# Patient Record
Sex: Female | Born: 1995 | Race: Black or African American | Hispanic: No | Marital: Single | Smoking: Never smoker
Health system: Southern US, Community
[De-identification: ages and names within clinical notes are randomized; demographics above are authoritative.]

## PROBLEM LIST (undated history)

## (undated) DIAGNOSIS — A749 Chlamydial infection, unspecified: Secondary | ICD-10-CM

---

## 2016-02-16 ENCOUNTER — Encounter (HOSPITAL_COMMUNITY): Payer: Self-pay | Admitting: Emergency Medicine

## 2016-02-16 ENCOUNTER — Emergency Department (HOSPITAL_COMMUNITY)
Admission: EM | Admit: 2016-02-16 | Discharge: 2016-02-16 | Disposition: A | Discharge status: Home / Self Care | Payer: Medicaid Other | Attending: Emergency Medicine | Admitting: Emergency Medicine

## 2016-02-16 ENCOUNTER — Emergency Department (HOSPITAL_COMMUNITY): Payer: Medicaid Other

## 2016-02-16 DIAGNOSIS — F17210 Nicotine dependence, cigarettes, uncomplicated: Secondary | ICD-10-CM | POA: Insufficient documentation

## 2016-02-16 DIAGNOSIS — R109 Unspecified abdominal pain: Secondary | ICD-10-CM

## 2016-02-16 DIAGNOSIS — R1030 Lower abdominal pain, unspecified: Secondary | ICD-10-CM | POA: Diagnosis present

## 2016-02-16 DIAGNOSIS — N3001 Acute cystitis with hematuria: Secondary | ICD-10-CM | POA: Diagnosis not present

## 2016-02-16 LAB — URINALYSIS, ROUTINE W REFLEX MICROSCOPIC
BILIRUBIN URINE: NEGATIVE
Glucose, UA: NEGATIVE mg/dL
KETONES UR: NEGATIVE mg/dL
NITRITE: NEGATIVE
Protein, ur: 30 mg/dL — AB
SPECIFIC GRAVITY, URINE: 1.018 (ref 1.005–1.030)
pH: 6.5 (ref 5.0–8.0)

## 2016-02-16 LAB — I-STAT BETA HCG BLOOD, ED (MC, WL, AP ONLY)

## 2016-02-16 LAB — URINE MICROSCOPIC-ADD ON

## 2016-02-16 MED ORDER — IBUPROFEN 400 MG PO TABS: 400.0000 mg | ORAL_TABLET | Freq: Three times a day (TID) | ORAL | 0 refills | Status: DC | PRN

## 2016-02-16 MED ORDER — IBUPROFEN 200 MG PO TABS
600.0000 mg | ORAL_TABLET | Freq: Once | ORAL | Status: AC
  Administered 2016-02-16: 600 mg via ORAL
  Filled 2016-02-16: qty 1

## 2016-02-16 MED ORDER — CEPHALEXIN 500 MG PO CAPS: 500.0000 mg | ORAL_CAPSULE | Freq: Three times a day (TID) | ORAL | 0 refills | Status: DC

## 2016-02-16 MED ORDER — CEPHALEXIN 250 MG PO CAPS
500.0000 mg | ORAL_CAPSULE | Freq: Once | ORAL | Status: AC
  Administered 2016-02-16: 500 mg via ORAL
  Filled 2016-02-16: qty 2

## 2016-02-16 MED ORDER — OXYCODONE-ACETAMINOPHEN 5-325 MG PO TABS
2.0000 | ORAL_TABLET | Freq: Once | ORAL | Status: AC
  Administered 2016-02-16: 2 via ORAL
  Filled 2016-02-16: qty 2

## 2016-02-16 NOTE — ED Provider Notes (Signed)
[facility]PT Provider Note   CSN: [id number] Arrival date & time: 02/16/16  0441     History   Chief Complaint Chief Complaint  Patient presents with  . Abdominal Pain  . Vaginal Pain    HPI Christina Gill is a 20 y.o. female.  HPI Patient presents to the emergency room with complaints of sharp stabbing lower abdominal and vaginal pain.  No vaginal discharge.  She does have some mild right flank pain yesterday.  No history of ureteral stone.  Denies nausea vomiting.  No fevers or chills.  Denies dysuria and urinary frequency.  She states the pain awoke her and she immediately came to the ER.  She's not tried a medications at home prior to arrival.  Currently her pain is mild to moderate in severity.   History reviewed. No pertinent past medical history.  There are no active problems to display for this patient.   History reviewed. No pertinent surgical history.  OB History    No data available       Home Medications    Prior to Admission medications   Medication Sig Start Date End Date Taking? Authorizing Provider  cephALEXin (KEFLEX) 500 MG capsule Take 1 capsule (500 mg total) by mouth 3 (three) times daily. 02/16/16   Azalia BilisKevin Zarahi Fuerst, MD  ibuprofen (ADVIL,MOTRIN) 400 MG tablet Take 1 tablet (400 mg total) by mouth every 8 (eight) hours as needed. 02/16/16   Azalia BilisKevin Traci Gafford, MD    Family History No family history on file.  Social History Social History  Substance Use Topics  . Smoking status: Current Every Day Smoker    Types: Cigarettes  . Smokeless tobacco: Never Used  . Alcohol use Not on file     Allergies   Patient has no known allergies.   Review of Systems Review of Systems  All other systems reviewed and are negative.    Physical Exam Updated Vital Signs BP 133/94 (BP Location: Right Arm)   Pulse 73   Temp 97.7 F (36.5 C) (Oral)   Resp 18   SpO2 100%   Physical Exam  Constitutional: She is oriented to person, place, and time. She  appears well-developed and well-nourished.  HENT:  Head: Normocephalic.  Eyes: EOM are normal.  Neck: Normal range of motion.  Cardiovascular: Normal rate.   Pulmonary/Chest: Effort normal.  Abdominal: She exhibits no distension.  Mild suprapubic tenderness.  No guarding or rebound.  Musculoskeletal: Normal range of motion.  Neurological: She is alert and oriented to person, place, and time.  Psychiatric: She has a normal mood and affect.  Nursing note and vitals reviewed.    ED Treatments / Results  Labs (all labs ordered are listed, but only abnormal results are displayed) Labs Reviewed  URINALYSIS, ROUTINE W REFLEX MICROSCOPIC (NOT AT [facility]) - Abnormal; Notable for the following:       Result Value   APPearance TURBID (*)    Hgb urine dipstick MODERATE (*)    Protein, ur 30 (*)    Leukocytes, UA LARGE (*)    All other components within normal limits  URINE MICROSCOPIC-ADD ON - Abnormal; Notable for the following:    Squamous Epithelial / LPF 0-5 (*)    Bacteria, UA FEW (*)    All other components within normal limits  URINE CULTURE  I-STAT BETA HCG BLOOD, ED ([facility], [facility], [facility] ONLY)    EKG  EKG Interpretation None       Radiology Ct Renal Stone Study  Result  Date: 02/16/2016 CLINICAL DATA:  Right lower quadrant abdominal pain and flank pain tonight. EXAM: CT ABDOMEN AND PELVIS WITHOUT CONTRAST TECHNIQUE: Multidetector CT imaging of the abdomen and pelvis was performed following the standard protocol without IV contrast. COMPARISON:  None. FINDINGS: Lower chest: No significant abnormality Hepatobiliary: No focal liver abnormality is seen. No gallstones, gallbladder wall thickening, or biliary dilatation. Pancreas: Unremarkable. No pancreatic ductal dilatation or surrounding inflammatory changes. Spleen: Normal in size without focal abnormality. Adrenals/Urinary Tract: Adrenal glands are unremarkable. Kidneys are normal, without renal calculi, focal lesion, or hydronephrosis.  Bladder is unremarkable. Stomach/Bowel: Stomach is within normal limits. Appendix appears normal. No evidence of bowel wall thickening, distention, or inflammatory changes. Vascular/Lymphatic: No significant vascular findings are present. No enlarged abdominal or pelvic lymph nodes. Reproductive: Uterus and bilateral adnexa are unremarkable. IUD appears appropriately positioned. Other: Small volume free pelvic fluid, particularly in the right adnexal region. Musculoskeletal: No significant abnormality. IMPRESSION: Small volume free pelvic fluid in the right adnexal region. Normal appendix. No urinary calculi. Electronically Signed   By: Ellery Plunkaniel R Mitchell M.D.   On: 02/16/2016 06:28    Procedures Procedures (including critical care time)  Medications Ordered in ED Medications  cephALEXin (KEFLEX) capsule 500 mg (not administered)  ibuprofen (ADVIL,MOTRIN) tablet 600 mg (600 mg Oral Given 02/16/16 [id number])  oxyCODONE-acetaminophen (PERCOCET/ROXICET) 5-325 MG per tablet 2 tablet (2 tablets Oral Given 02/16/16 [id number])     Initial Impression / Assessment and Plan / ED Course  I have reviewed the triage vital signs and the nursing notes.  Pertinent labs & imaging results that were available during my care of the patient were reviewed by me and considered in my medical decision making (see chart for details).  Clinical Course     Urine culture sent.  We'll initiate Keflex.  CT scan demonstrates no evidence of distal ureteral stone.  Well-appearing.  Nontoxic.  Keflex.  Patient given strict return precautions  Final Clinical Impressions(s) / ED Diagnoses   Final diagnoses:  Abdominal pain, unspecified abdominal location  Acute cystitis with hematuria    New Prescriptions New Prescriptions   CEPHALEXIN (KEFLEX) 500 MG CAPSULE    Take 1 capsule (500 mg total) by mouth 3 (three) times daily.   IBUPROFEN (ADVIL,MOTRIN) 400 MG TABLET    Take 1 tablet (400 mg total) by mouth every 8 (eight) hours as  needed.     Azalia BilisKevin Heidemarie Goodnow, MD 02/16/16 [phone]

## 2016-02-16 NOTE — ED Triage Notes (Signed)
Pt awoke from sleep at 3 a.m.with sudden sharp stabbing pain from lower abdomen into vagina.  No history of any pain like this in the past.  LMP 3 weeks ago.  Pt has an IUD in place for the past 8 or 9 months.  Had an IUD about a year ago but had to have it removed.

## 2016-02-18 LAB — URINE CULTURE: Culture: >=100000 — AB

## 2016-02-19 ENCOUNTER — Telehealth (HOSPITAL_BASED_OUTPATIENT_CLINIC_OR_DEPARTMENT_OTHER): Payer: Self-pay

## 2016-02-19 NOTE — Progress Notes (Signed)
ED Antimicrobial Stewardship Positive Culture Follow Up   Christina Gill is an 20 y.o. female who [facility] with a chief complaint of  Chief Complaint  Patient presents with  . Abdominal Pain  . Vaginal Pain    Recent Results (from the past 720 hour(s))  Urine culture     Status: Abnormal   Collection Time: 02/16/16  5:15 AM  Result Value Ref Range Status   Specimen Description URINE, RANDOM  Final   Special Requests NONE  Final   Culture (A)  Final    >=100,000 COLONIES/mL STAPHYLOCOCCUS SPECIES (COAGULASE NEGATIVE)   Report Status 02/18/2016 FINAL  Final   Organism ID, Bacteria STAPHYLOCOCCUS SPECIES (COAGULASE NEGATIVE) (A)  Final      Susceptibility   Staphylococcus species (coagulase negative) - MIC*    CIPROFLOXACIN <=0.5 SENSITIVE Sensitive     GENTAMICIN <=0.5 SENSITIVE Sensitive     NITROFURANTOIN <=16 SENSITIVE Sensitive     OXACILLIN 1 RESISTANT Resistant     TETRACYCLINE <=1 SENSITIVE Sensitive     VANCOMYCIN <=0.5 SENSITIVE Sensitive     TRIMETH/SULFA <=10 SENSITIVE Sensitive     CLINDAMYCIN <=0.25 SENSITIVE Sensitive     RIFAMPIN <=0.5 SENSITIVE Sensitive     Inducible Clindamycin NEGATIVE Sensitive     * >=100,000 COLONIES/mL STAPHYLOCOCCUS SPECIES (COAGULASE NEGATIVE)    [x]  Treated with Keflex, organism resistant to prescribed antimicrobial []  Patient discharged originally without antimicrobial agent and treatment is now indicated  New antibiotic prescription: Bactrim DS 1 tab bid x 5 days  ED Provider: Audry Piliyler Mohr, PA-C  Rolley SimsMartin, Chrystle Murillo Ann 02/19/2016, 10:19 AM Infectious Diseases Pharmacist Phone# [phone]

## 2016-02-19 NOTE — Telephone Encounter (Signed)
Post ED Visit - Positive Culture Follow-up: Successful Patient Follow-Up  Culture assessed and recommendations reviewed by: []  Enzo BiNathan Batchelder, Pharm.D. []  Celedonio MiyamotoJeremy Frens, Pharm.D., BCPS []  Garvin FilaMike Maccia, Pharm.D. [x]  Georgina PillionElizabeth Martin, Pharm.D., BCPS []  GreenMinh Pham, [place]arm.D., BCPS, AAHIVP []  Estella HuskMichelle Turner, Pharm.D., BCPS, AAHIVP []  Cassie Stewart, Pharm.D. []  Sherle Poeob Vincent, [address]Pharm[address]  Positive urine culture, >/= 100,000 colonies -> Staph species  []  Patient discharged without antimicrobial prescription and treatment is now indicated [x]  Organism is resistant to prescribed ED discharge antimicrobial(Keflex) []  Patient with positive blood cultures  Changes discussed with ED provider: Audry Piliyler Mohr PA New antibiotic prescription "Bactrim DS 1 tab BID x 5d"  [facility] [place] [phone], Rx given to RPh.  Contacted patient, date 02/19/2016, time 1113  Pt informed of Dx, need for addl tx and to stop Keflex.   Arvid RightClark, Devonna Oboyle Dorn 02/19/2016, 11:33 AM

## 2016-04-21 DIAGNOSIS — B379 Candidiasis, unspecified: Secondary | ICD-10-CM | POA: Diagnosis not present

## 2016-04-21 DIAGNOSIS — Z760 Encounter for issue of repeat prescription: Secondary | ICD-10-CM | POA: Diagnosis not present

## 2016-04-21 DIAGNOSIS — J329 Chronic sinusitis, unspecified: Secondary | ICD-10-CM | POA: Diagnosis not present

## 2016-05-24 DIAGNOSIS — Z111 Encounter for screening for respiratory tuberculosis: Secondary | ICD-10-CM | POA: Diagnosis not present

## 2016-05-27 DIAGNOSIS — Z111 Encounter for screening for respiratory tuberculosis: Secondary | ICD-10-CM | POA: Diagnosis not present

## 2016-06-04 DIAGNOSIS — R10814 Left lower quadrant abdominal tenderness: Secondary | ICD-10-CM | POA: Diagnosis not present

## 2016-06-04 DIAGNOSIS — R109 Unspecified abdominal pain: Secondary | ICD-10-CM | POA: Diagnosis not present

## 2016-06-04 DIAGNOSIS — R10813 Right lower quadrant abdominal tenderness: Secondary | ICD-10-CM | POA: Diagnosis not present

## 2016-06-28 DIAGNOSIS — R300 Dysuria: Secondary | ICD-10-CM | POA: Diagnosis not present

## 2016-06-28 DIAGNOSIS — Z0000 Encounter for general adult medical examination without abnormal findings: Secondary | ICD-10-CM | POA: Diagnosis not present

## 2016-06-28 DIAGNOSIS — N760 Acute vaginitis: Secondary | ICD-10-CM | POA: Diagnosis not present

## 2016-07-12 DIAGNOSIS — Z13220 Encounter for screening for lipoid disorders: Secondary | ICD-10-CM | POA: Diagnosis not present

## 2016-07-12 DIAGNOSIS — Z7189 Other specified counseling: Secondary | ICD-10-CM | POA: Diagnosis not present

## 2016-07-12 DIAGNOSIS — E669 Obesity, unspecified: Secondary | ICD-10-CM | POA: Diagnosis not present

## 2016-07-12 DIAGNOSIS — Z0000 Encounter for general adult medical examination without abnormal findings: Secondary | ICD-10-CM | POA: Diagnosis not present

## 2016-07-12 DIAGNOSIS — Z23 Encounter for immunization: Secondary | ICD-10-CM | POA: Diagnosis not present

## 2016-07-12 DIAGNOSIS — Z131 Encounter for screening for diabetes mellitus: Secondary | ICD-10-CM | POA: Diagnosis not present

## 2016-09-23 ENCOUNTER — Encounter (HOSPITAL_BASED_OUTPATIENT_CLINIC_OR_DEPARTMENT_OTHER): Payer: Self-pay

## 2016-09-23 DIAGNOSIS — N7689 Other specified inflammation of vagina and vulva: Secondary | ICD-10-CM | POA: Diagnosis not present

## 2016-09-23 DIAGNOSIS — F17210 Nicotine dependence, cigarettes, uncomplicated: Secondary | ICD-10-CM | POA: Diagnosis not present

## 2016-09-23 DIAGNOSIS — J029 Acute pharyngitis, unspecified: Secondary | ICD-10-CM | POA: Diagnosis not present

## 2016-09-23 DIAGNOSIS — N739 Female pelvic inflammatory disease, unspecified: Secondary | ICD-10-CM | POA: Diagnosis not present

## 2016-09-23 LAB — PREGNANCY, URINE: Preg Test, Ur: NEGATIVE

## 2016-09-23 LAB — URINALYSIS, ROUTINE W REFLEX MICROSCOPIC
BILIRUBIN URINE: NEGATIVE
GLUCOSE, UA: NEGATIVE mg/dL
HGB URINE DIPSTICK: NEGATIVE
Ketones, ur: 15 mg/dL — AB
Nitrite: NEGATIVE
PROTEIN: NEGATIVE mg/dL
SPECIFIC GRAVITY, URINE: 1.028 (ref 1.005–1.030)
pH: 7 (ref 5.0–8.0)

## 2016-09-23 LAB — URINALYSIS, MICROSCOPIC (REFLEX)

## 2016-09-23 NOTE — ED Triage Notes (Signed)
Pt reports white, vaginal discharge, pain and itching x2 weeks. Associated sore throat. Denies any attempts at OTC treatment.

## 2016-09-24 ENCOUNTER — Emergency Department (HOSPITAL_BASED_OUTPATIENT_CLINIC_OR_DEPARTMENT_OTHER)
Admission: EM | Admit: 2016-09-24 | Discharge: 2016-09-24 | Disposition: A | Discharge status: Home / Self Care | Payer: BLUE CROSS/BLUE SHIELD | Attending: Emergency Medicine | Admitting: Emergency Medicine

## 2016-09-24 DIAGNOSIS — N739 Female pelvic inflammatory disease, unspecified: Secondary | ICD-10-CM

## 2016-09-24 DIAGNOSIS — J029 Acute pharyngitis, unspecified: Secondary | ICD-10-CM

## 2016-09-24 LAB — WET PREP, GENITAL
Clue Cells Wet Prep HPF POC: NONE SEEN
SPERM: NONE SEEN
Trich, Wet Prep: NONE SEEN
Yeast Wet Prep HPF POC: NONE SEEN

## 2016-09-24 MED ORDER — CEFTRIAXONE SODIUM 250 MG IJ SOLR
250.0000 mg | Freq: Once | INTRAMUSCULAR | Status: AC
  Administered 2016-09-24: 250 mg via INTRAMUSCULAR
  Filled 2016-09-24: qty 250

## 2016-09-24 MED ORDER — DOXYCYCLINE HYCLATE 100 MG PO TABS
100.0000 mg | ORAL_TABLET | Freq: Once | ORAL | Status: AC
  Administered 2016-09-24: 100 mg via ORAL
  Filled 2016-09-24: qty 1

## 2016-09-24 MED ORDER — DOXYCYCLINE HYCLATE 100 MG PO CAPS: 100.0000 mg | ORAL_CAPSULE | Freq: Two times a day (BID) | ORAL | 0 refills | Status: DC

## 2016-09-24 NOTE — ED Provider Notes (Signed)
[facility]   CSN: [id number] Arrival date & time: 09/23/16  2315     History   Chief Complaint Chief Complaint  Patient presents with  . Vaginal Itching    HPI Christina Gill is a 21 y.o. female.  HPI Patient reports new vaginal discharge and itching over the past 2 weeks.  She reports unprotected sexual contacts.  She does have a history of STDs before in the past.  She denies significant abdominal pain.  Denies nausea vomiting diarrhea.  No fevers or chills.  No back pain or flank pain.  She reports mild sore throat over the past several days as well.  Some coughing congestion.  No shortness of breath.  Symptoms are moderate in severity.   History reviewed. No pertinent past medical history.  There are no active problems to display for this patient.   History reviewed. No pertinent surgical history.  OB History    No data available       Home Medications    Prior to Admission medications   Medication Sig Start Date End Date Taking? Authorizing Provider  cephALEXin (KEFLEX) 500 MG capsule Take 1 capsule (500 mg total) by mouth 3 (three) times daily. 02/16/16   Azalia Bilis, MD  doxycycline (VIBRAMYCIN) 100 MG capsule Take 1 capsule (100 mg total) by mouth 2 (two) times daily. 09/24/16   Azalia Bilis, MD  ibuprofen (ADVIL,MOTRIN) 400 MG tablet Take 1 tablet (400 mg total) by mouth every 8 (eight) hours as needed. 02/16/16   Azalia Bilis, MD    Family History History reviewed. No pertinent family history.  Social History Social History  Substance Use Topics  . Smoking status: Current Every Day Smoker    Types: Cigarettes  . Smokeless tobacco: Never Used  . Alcohol use No     Allergies   Patient has no known allergies.   Review of Systems Review of Systems  All other systems reviewed and are negative.    Physical Exam Updated Vital Signs BP (!) 130/92 (BP Location: Left Arm)   Pulse 73   Temp 98.6 F (37 C)  (Oral)   Resp 16   Ht 5\' 7"  (1.702 m)   Wt 83.9 kg (185 lb)   LMP 09/23/2016   SpO2 100%   BMI 28.98 kg/m   Physical Exam  Constitutional: She is oriented to person, place, and time. She appears well-developed and well-nourished.  HENT:  Head: Normocephalic.  Uvula midline.  Mild posterior pharyngeal erythema.  No tonsillar swelling or exudate.  Eyes: EOM are normal.  Neck: Normal range of motion.  Pulmonary/Chest: Effort normal.  Abdominal: She exhibits no distension.  Genitourinary:  Genitourinary Comments: Chaperone present.  Mild cervical and vaginal discharge noted.  No cervical erosions.  No significant cervical motion tenderness.  IUD string is visible.  Os is closed.  Musculoskeletal: Normal range of motion.  Neurological: She is alert and oriented to person, place, and time.  Psychiatric: She has a normal mood and affect.  Nursing note and vitals reviewed.    ED Treatments / Results  Labs (all labs ordered are listed, but only abnormal results are displayed) Labs Reviewed  WET PREP, GENITAL - Abnormal; Notable for the following:       Result Value   WBC, Wet Prep HPF POC MANY (*)    All other components within normal limits  URINALYSIS, ROUTINE W REFLEX MICROSCOPIC - Abnormal; Notable for the following:    Color, Urine AMBER (*)  Ketones, ur 15 (*)    Leukocytes, UA TRACE (*)    All other components within normal limits  URINALYSIS, MICROSCOPIC (REFLEX) - Abnormal; Notable for the following:    Bacteria, UA FEW (*)    Squamous Epithelial / LPF 6-30 (*)    All other components within normal limits  PREGNANCY, URINE  HIV ANTIBODY (ROUTINE TESTING)  RPR  GC/CHLAMYDIA PROBE AMP ([place]) NOT AT [facility]    EKG  EKG Interpretation None       Radiology No results found.  Procedures Procedures (including critical care time)  Medications Ordered in ED Medications  cefTRIAXone (ROCEPHIN) injection 250 mg (250 mg Intramuscular Given 09/24/16 0217)    doxycycline (VIBRA-TABS) tablet 100 mg (100 mg Oral Given 09/24/16 0217)     Initial Impression / Assessment and Plan / ED Course  I have reviewed the triage vital signs and the nursing notes.  Pertinent labs & imaging results that were available during my care of the patient were reviewed by me and considered in my medical decision making (see chart for details).     Patient be covered for early pelvic inflammatory disease with Rocephin and doxycycline.  Recommended all sexual partners be seen and evaluated at the health department.  Acute pharyngitis likely viral in nature.  Final Clinical Impressions(s) / ED Diagnoses   Final diagnoses:  Acute pharyngitis, unspecified etiology  Pelvic inflammatory disease (PID)    New Prescriptions Discharge Medication List as of 09/24/2016  2:07 AM    START taking these medications   Details  doxycycline (VIBRAMYCIN) 100 MG capsule Take 1 capsule (100 mg total) by mouth 2 (two) times daily., Starting Fri 09/24/2016, Print         Azalia Bilisampos, Lucion Dilger, MD 09/24/16 [phone]

## 2016-09-25 LAB — RPR: RPR: NONREACTIVE

## 2016-09-25 LAB — HIV ANTIBODY (ROUTINE TESTING W REFLEX): HIV SCREEN 4TH GENERATION: NONREACTIVE

## 2016-09-27 LAB — GC/CHLAMYDIA PROBE AMP (~~LOC~~) NOT AT ARMC
CHLAMYDIA, DNA PROBE: NEGATIVE
NEISSERIA GONORRHEA: NEGATIVE

## 2016-09-27 NOTE — ED Notes (Signed)
Pt. Called for her STD results.  Results given.

## 2017-01-19 ENCOUNTER — Other Ambulatory Visit: Payer: Self-pay

## 2017-01-19 ENCOUNTER — Encounter (HOSPITAL_COMMUNITY): Payer: Self-pay | Admitting: Emergency Medicine

## 2017-01-19 ENCOUNTER — Ambulatory Visit (HOSPITAL_COMMUNITY)
Admission: EM | Admit: 2017-01-19 | Discharge: 2017-01-19 | Disposition: A | Discharge status: Home / Self Care | Payer: BLUE CROSS/BLUE SHIELD | Attending: Family Medicine | Admitting: Family Medicine

## 2017-01-19 DIAGNOSIS — Z3202 Encounter for pregnancy test, result negative: Secondary | ICD-10-CM | POA: Diagnosis not present

## 2017-01-19 DIAGNOSIS — R1032 Left lower quadrant pain: Secondary | ICD-10-CM | POA: Diagnosis not present

## 2017-01-19 DIAGNOSIS — N898 Other specified noninflammatory disorders of vagina: Secondary | ICD-10-CM | POA: Insufficient documentation

## 2017-01-19 DIAGNOSIS — R1030 Lower abdominal pain, unspecified: Secondary | ICD-10-CM | POA: Diagnosis not present

## 2017-01-19 DIAGNOSIS — Z975 Presence of (intrauterine) contraceptive device: Secondary | ICD-10-CM | POA: Diagnosis not present

## 2017-01-19 DIAGNOSIS — R1031 Right lower quadrant pain: Secondary | ICD-10-CM

## 2017-01-19 LAB — POCT PREGNANCY, URINE: Preg Test, Ur: NEGATIVE

## 2017-01-19 LAB — POCT URINALYSIS DIP (DEVICE)
GLUCOSE, UA: NEGATIVE mg/dL
Ketones, ur: 15 mg/dL — AB
LEUKOCYTES UA: NEGATIVE
NITRITE: NEGATIVE
PROTEIN: 30 mg/dL — AB
Specific Gravity, Urine: >=1.03 (ref 1.005–1.030)
UROBILINOGEN UA: 1 mg/dL (ref 0.0–1.0)
pH: 6.5 (ref 5.0–8.0)

## 2017-01-19 MED ORDER — METRONIDAZOLE 500 MG PO TABS: 500.0000 mg | ORAL_TABLET | Freq: Two times a day (BID) | ORAL | 0 refills | Status: DC

## 2017-01-19 MED ORDER — AZITHROMYCIN 250 MG PO TABS
ORAL_TABLET | ORAL | Status: AC
  Filled 2017-01-19: qty 4

## 2017-01-19 MED ORDER — STERILE WATER FOR INJECTION IJ SOLN
INTRAMUSCULAR | Status: AC
  Filled 2017-01-19: qty 10

## 2017-01-19 MED ORDER — CEFTRIAXONE SODIUM 250 MG IJ SOLR
250.0000 mg | Freq: Once | INTRAMUSCULAR | Status: AC
  Administered 2017-01-19: 250 mg via INTRAMUSCULAR

## 2017-01-19 MED ORDER — AZITHROMYCIN 250 MG PO TABS
1000.0000 mg | ORAL_TABLET | Freq: Once | ORAL | Status: AC
  Administered 2017-01-19: 1000 mg via ORAL

## 2017-01-19 MED ORDER — CEFTRIAXONE SODIUM 250 MG IJ SOLR
INTRAMUSCULAR | Status: AC
  Filled 2017-01-19: qty 250

## 2017-01-19 NOTE — ED Triage Notes (Signed)
Pt reports lower abdominal/suprapubic pain, nausea, loss of appetite and headache for over one week.  She also reports a creamy white vaginal d/c with odor.

## 2017-01-19 NOTE — Medical Student Note (Signed)
[facility] Provider Student Note For educational purposes for Medical, PA and NP students only and not part of the legal medical record.   CSN: [id number] Arrival date & time: 01/19/17  1135   History   Chief Complaint Chief Complaint  Patient presents with  . Abdominal Pain  . Nausea  . Headache    HPI Renard Hampersha Phillips-Campbell is a 21 y.o. female who is complaining of lower abdominal pain, nausea, vaginal discharge with odor, and decreased appetite for 1.5 weeks. LMP was 10/23. She is lesbian and has one partner. She is not concerned about pregnancy. She has had unprotected intercourse but she is not concerned about STDs because it is a monogamous relationship. She also complains of urinary leakage and increased BM frequency (usually 1x per week, now daily). She denies diarrhea, fever, vaginal itching, dysuria.     History reviewed. No pertinent past medical history.  There are no active problems to display for this patient.   History reviewed. No pertinent surgical history.  OB History    No data available       Home Medications    Prior to Admission medications   Medication Sig Start Date End Date Taking? Authorizing Provider  cephALEXin (KEFLEX) 500 MG capsule Take 1 capsule (500 mg total) by mouth 3 (three) times daily. 02/16/16   Azalia Bilisampos, Kevin, MD  doxycycline (VIBRAMYCIN) 100 MG capsule Take 1 capsule (100 mg total) by mouth 2 (two) times daily. 09/24/16   Azalia Bilisampos, Kevin, MD  ibuprofen (ADVIL,MOTRIN) 400 MG tablet Take 1 tablet (400 mg total) by mouth every 8 (eight) hours as needed. 02/16/16   Azalia Bilisampos, Kevin, MD    Family History History reviewed. No pertinent family history.  Social History Social History   Tobacco Use  . Smoking status: Never Smoker  . Smokeless tobacco: Never Used  Substance Use Topics  . Alcohol use: Yes    Comment: occasional  . Drug use: Yes    Frequency: 7.0 times per week    Types: Marijuana     Allergies   Patient  has no known allergies.   Review of Systems Review of Systems  Constitutional: Positive for appetite change and chills. Negative for fever.  Gastrointestinal: Positive for abdominal pain and nausea. Negative for blood in stool, constipation, diarrhea and vomiting.  Genitourinary: Positive for vaginal discharge. Negative for difficulty urinating, dysuria, frequency, hematuria, urgency and vaginal bleeding.     Physical Exam Updated Vital Signs BP (!) 138/96 (BP Location: Left Arm)   Pulse 91   Temp 98.1 F (36.7 C) (Oral)   LMP 01/04/2017 (Exact Date)   SpO2 100%   Physical Exam  Constitutional: She appears well-developed and well-nourished.  Eyes: Pupils are equal, round, and reactive to light.  Cardiovascular: Normal rate and regular rhythm.  Pulmonary/Chest: Effort normal and breath sounds normal.  Abdominal: Soft. Normal appearance and bowel sounds are normal. She exhibits no distension and no mass. There is tenderness in the right lower quadrant, suprapubic area and left lower quadrant. There is no rigidity and no guarding.  Skin: Skin is warm and dry.     ED Treatments / Results  Labs (all labs ordered are listed, but only abnormal results are displayed) Labs Reviewed  POCT URINALYSIS DIP (DEVICE) - Abnormal; Notable for the following components:      Result Value   Bilirubin Urine SMALL (*)    Ketones, ur 15 (*)    Hgb urine dipstick TRACE (*)    Protein,  ur 30 (*)    All other components within normal limits  URINE CULTURE  POCT PREGNANCY, URINE  URINE CYTOLOGY ANCILLARY ONLY    Medications Ordered in ED Medications  cefTRIAXone (ROCEPHIN) injection 250 mg (not administered)  azithromycin (ZITHROMAX) tablet 1,000 mg (not administered)     Initial Impression / Assessment and Plan / ED Course  I have reviewed the triage vital signs and the nursing notes.  Pertinent labs & imaging results that were available during my care of the patient were reviewed by me  and considered in my medical decision making (see chart for details).     Urine culture and cytology sent. Patient treated empirically for GC/chlamydia and given prescription for Flagyl. She will follow up if symptoms do not improve in the next few days.  Final Clinical Impressions(s) / ED Diagnoses   Final diagnoses:  None    New Prescriptions This SmartLink is deprecated. Use AVSMEDLIST instead to display the medication list for a patient.

## 2017-01-19 NOTE — Discharge Instructions (Addendum)
Please return here or to the Emergency Department immediately should you feel worse in any way or have any of the following symptoms: increasing or different abdominal pain, persistent vomiting, fevers, or shaking chills. Please follow up here or with your primary care doctor for a recheck in 48 hours or sooner in order to ensure that you are not developing a problem that might require surgery or hospitalization.

## 2017-01-20 LAB — URINE CYTOLOGY ANCILLARY ONLY
CHLAMYDIA, DNA PROBE: NEGATIVE
NEISSERIA GONORRHEA: NEGATIVE
TRICH (WINDOWPATH): NEGATIVE

## 2017-01-20 LAB — URINE CULTURE: Culture: 2000 — AB

## 2017-01-24 LAB — URINE CYTOLOGY ANCILLARY ONLY: CANDIDA VAGINITIS: NEGATIVE

## 2017-01-26 NOTE — ED Provider Notes (Signed)
[facility]   [id number] 01/19/17 Arrival Time: 1135  ASSESSMENT & PLAN:  1. Abdominal discomfort, bilateral lower quadrant   2. Vaginal discharge     Meds ordered this encounter  Medications  . metroNIDAZOLE (FLAGYL) 500 MG tablet    Sig: Take 1 tablet (500 mg total) 2 (two) times daily by mouth.    Dispense:  14 tablet    Refill:  0  . cefTRIAXone (ROCEPHIN) injection 250 mg  . azithromycin (ZITHROMAX) tablet 1,000 mg   Empiric treatment given. Urine cytology sent. She may f/u if not improving over the next 24-48 hours.  Reviewed expectations re: course of current medical issues. Questions answered. Outlined signs and symptoms indicating need for more acute intervention. Patient verbalized understanding. After Visit Summary given.   SUBJECTIVE:  Christina Gill is a 21 y.o. female who presents with complaint of lower abdominal discomfort with loss of appetite. Gradual onset over the past week. White vaginal discharge with odor. H/O similar when she was diagnosed with BV; "feels the same." No specific pelvic pain. No urinary symptoms. Sexually active with female partner. Occasional condom use. Patient's last menstrual period was 01/04/2017 (exact date).  ROS: As per HPI.   OBJECTIVE:  Vitals:   01/19/17 1153  BP: (!) 138/96  Pulse: 91  Temp: 98.1 F (36.7 C)  TempSrc: Oral  SpO2: 100%    General appearance: alert; no distress Lungs: clear to auscultation bilaterally Heart: regular rate and rhythm Abdomen: soft, non-tender; bowel sounds normal; no masses or organomegaly; no guarding or rebound tenderness GU: declines Back: no CVA tenderness Extremities: no cyanosis or edema; symmetrical with no gross deformities Skin: warm and dry Psychological: alert and cooperative; normal mood and affect  Labs: Results for orders placed or performed during the hospital encounter of 01/19/17  Urine culture  Result Value Ref Range   Specimen Description  URINE, RANDOM    Special Requests NONE    Culture (A)     2,000 COLONIES/mL GROUP B STREP(S.AGALACTIAE)ISOLATED WITH MIXED BACTERIAL ORGANISMS    Report Status 01/20/2017 FINAL   POCT urinalysis dip (device)  Result Value Ref Range   Glucose, UA NEGATIVE NEGATIVE mg/dL   Bilirubin Urine SMALL (A) NEGATIVE   Ketones, ur 15 (A) NEGATIVE mg/dL   Specific Gravity, Urine >=1.030 1.005 - 1.030   Hgb urine dipstick TRACE (A) NEGATIVE   pH 6.5 5.0 - 8.0   Protein, ur 30 (A) NEGATIVE mg/dL   Urobilinogen, UA 1.0 0.0 - 1.0 mg/dL   Nitrite NEGATIVE NEGATIVE   Leukocytes, UA NEGATIVE NEGATIVE  Pregnancy, urine POC  Result Value Ref Range   Preg Test, Ur NEGATIVE NEGATIVE  Urine cytology ancillary only  Result Value Ref Range   Chlamydia Negative    Neisseria gonorrhea Negative    Trichomonas Negative   Urine cytology ancillary only  Result Value Ref Range   Bacterial vaginitis (A)     **POSITIVE for Gardnerella vaginalis POSITIVE for Atopobium vaginae POSITIVE for Megasphaera 2**   Candida vaginitis Negative for Candida Vaginitis Microorganisms    Labs Reviewed  URINE CULTURE - Abnormal; Notable for the following components:      Result Value   Culture   (*)    Value: 2,000 COLONIES/mL GROUP B STREP(S.AGALACTIAE)ISOLATED WITH MIXED BACTERIAL ORGANISMS    All other components within normal limits  POCT URINALYSIS DIP (DEVICE) - Abnormal; Notable for the following components:   Bilirubin Urine SMALL (*)    Ketones, ur 15 (*)  Hgb urine dipstick TRACE (*)    Protein, ur 30 (*)    All other components within normal limits  URINE CYTOLOGY ANCILLARY ONLY - Abnormal; Notable for the following components:   Bacterial vaginitis   (*)    Value: **POSITIVE for Gardnerella vaginalis POSITIVE for Atopobium vaginae POSITIVE for Megasphaera 2**   All other components within normal limits  POCT PREGNANCY, URINE  URINE CYTOLOGY ANCILLARY ONLY    No Known Allergies  History reviewed. No  pertinent past medical history. Social History   Socioeconomic History  . Marital status: Single    Spouse name: Not on file  . Number of children: Not on file  . Years of education: Not on file  . Highest education level: Not on file  Social Needs  . Financial resource strain: Not on file  . Food insecurity - worry: Not on file  . Food insecurity - inability: Not on file  . Transportation needs - medical: Not on file  . Transportation needs - non-medical: Not on file  Occupational History  . Not on file  Tobacco Use  . Smoking status: Never Smoker  . Smokeless tobacco: Never Used  Substance and Sexual Activity  . Alcohol use: Yes    Comment: occasional  . Drug use: Yes    Frequency: 7.0 times per week    Types: Marijuana  . Sexual activity: Yes    Birth control/protection: IUD  Other Topics Concern  . Not on file  Social History Narrative  . Not on file      Mardella LaymanHagler, Leatha Rohner, MD 01/26/17 1014

## 2017-05-27 DIAGNOSIS — L700 Acne vulgaris: Secondary | ICD-10-CM | POA: Diagnosis not present

## 2017-06-09 DIAGNOSIS — L308 Other specified dermatitis: Secondary | ICD-10-CM | POA: Diagnosis not present

## 2017-10-14 DIAGNOSIS — Z114 Encounter for screening for human immunodeficiency virus [HIV]: Secondary | ICD-10-CM | POA: Diagnosis not present

## 2017-10-14 DIAGNOSIS — Z118 Encounter for screening for other infectious and parasitic diseases: Secondary | ICD-10-CM | POA: Diagnosis not present

## 2017-10-14 DIAGNOSIS — Z01419 Encounter for gynecological examination (general) (routine) without abnormal findings: Secondary | ICD-10-CM | POA: Diagnosis not present

## 2017-10-14 DIAGNOSIS — N760 Acute vaginitis: Secondary | ICD-10-CM | POA: Diagnosis not present

## 2017-10-14 DIAGNOSIS — Z6826 Body mass index (BMI) 26.0-26.9, adult: Secondary | ICD-10-CM | POA: Diagnosis not present

## 2017-10-14 DIAGNOSIS — Z1159 Encounter for screening for other viral diseases: Secondary | ICD-10-CM | POA: Diagnosis not present

## 2017-10-14 DIAGNOSIS — Z113 Encounter for screening for infections with a predominantly sexual mode of transmission: Secondary | ICD-10-CM | POA: Diagnosis not present

## 2017-10-14 DIAGNOSIS — N898 Other specified noninflammatory disorders of vagina: Secondary | ICD-10-CM | POA: Diagnosis not present

## 2017-12-22 DIAGNOSIS — Z113 Encounter for screening for infections with a predominantly sexual mode of transmission: Secondary | ICD-10-CM | POA: Diagnosis not present

## 2017-12-22 DIAGNOSIS — Z118 Encounter for screening for other infectious and parasitic diseases: Secondary | ICD-10-CM | POA: Diagnosis not present

## 2017-12-22 DIAGNOSIS — A5901 Trichomonal vulvovaginitis: Secondary | ICD-10-CM | POA: Diagnosis not present

## 2018-03-25 ENCOUNTER — Emergency Department (HOSPITAL_BASED_OUTPATIENT_CLINIC_OR_DEPARTMENT_OTHER)
Admission: EM | Admit: 2018-03-25 | Discharge: 2018-03-25 | Discharge status: Against Medical Advice | Payer: BLUE CROSS/BLUE SHIELD | Attending: Emergency Medicine | Admitting: Emergency Medicine

## 2018-03-25 ENCOUNTER — Other Ambulatory Visit: Payer: Self-pay

## 2018-03-25 ENCOUNTER — Encounter (HOSPITAL_BASED_OUTPATIENT_CLINIC_OR_DEPARTMENT_OTHER): Payer: Self-pay | Admitting: Emergency Medicine

## 2018-03-25 DIAGNOSIS — Z5321 Procedure and treatment not carried out due to patient leaving prior to being seen by health care provider: Secondary | ICD-10-CM | POA: Diagnosis not present

## 2018-03-25 DIAGNOSIS — N898 Other specified noninflammatory disorders of vagina: Secondary | ICD-10-CM | POA: Insufficient documentation

## 2018-03-25 HISTORY — DX: Chlamydial infection, unspecified: A74.9

## 2018-03-25 LAB — URINALYSIS, ROUTINE W REFLEX MICROSCOPIC
Bilirubin Urine: NEGATIVE
Glucose, UA: NEGATIVE mg/dL
Ketones, ur: NEGATIVE mg/dL
LEUKOCYTES UA: NEGATIVE
Nitrite: NEGATIVE
Protein, ur: NEGATIVE mg/dL
SPECIFIC GRAVITY, URINE: 1.015 (ref 1.005–1.030)
pH: 6 (ref 5.0–8.0)

## 2018-03-25 LAB — URINALYSIS, MICROSCOPIC (REFLEX)

## 2018-03-25 LAB — PREGNANCY, URINE: Preg Test, Ur: NEGATIVE

## 2018-03-25 NOTE — ED Notes (Signed)
Pelvic Cart at bedside 

## 2018-03-25 NOTE — ED Triage Notes (Signed)
Vaginal discharge for a few weeks. Recently treated for chlamydia.

## 2018-11-06 DIAGNOSIS — H5213 Myopia, bilateral: Secondary | ICD-10-CM | POA: Diagnosis not present

## 2019-05-15 DIAGNOSIS — Z114 Encounter for screening for human immunodeficiency virus [HIV]: Secondary | ICD-10-CM | POA: Diagnosis not present

## 2019-05-15 DIAGNOSIS — Z1159 Encounter for screening for other viral diseases: Secondary | ICD-10-CM | POA: Diagnosis not present

## 2019-05-15 DIAGNOSIS — N760 Acute vaginitis: Secondary | ICD-10-CM | POA: Diagnosis not present

## 2019-05-15 DIAGNOSIS — Z113 Encounter for screening for infections with a predominantly sexual mode of transmission: Secondary | ICD-10-CM | POA: Diagnosis not present

## 2019-05-15 DIAGNOSIS — Z118 Encounter for screening for other infectious and parasitic diseases: Secondary | ICD-10-CM | POA: Diagnosis not present

## 2019-05-15 DIAGNOSIS — Z30432 Encounter for removal of intrauterine contraceptive device: Secondary | ICD-10-CM | POA: Diagnosis not present

## 2019-05-16 ENCOUNTER — Other Ambulatory Visit: Payer: Self-pay

## 2019-05-16 ENCOUNTER — Emergency Department (HOSPITAL_COMMUNITY)
Admission: EM | Admit: 2019-05-16 | Discharge: 2019-05-17 | Disposition: A | Discharge status: Other Acute Inpatient Hospital | Payer: BC Managed Care – PPO | Attending: Emergency Medicine | Admitting: Emergency Medicine

## 2019-05-16 ENCOUNTER — Encounter (HOSPITAL_COMMUNITY): Payer: Self-pay

## 2019-05-16 DIAGNOSIS — R45851 Suicidal ideations: Secondary | ICD-10-CM | POA: Insufficient documentation

## 2019-05-16 DIAGNOSIS — T391X2A Poisoning by 4-Aminophenol derivatives, intentional self-harm, initial encounter: Secondary | ICD-10-CM | POA: Diagnosis not present

## 2019-05-16 DIAGNOSIS — F322 Major depressive disorder, single episode, severe without psychotic features: Secondary | ICD-10-CM | POA: Diagnosis not present

## 2019-05-16 DIAGNOSIS — R000 Tachycardia, unspecified: Secondary | ICD-10-CM | POA: Diagnosis not present

## 2019-05-16 DIAGNOSIS — T398X1A Poisoning by other nonopioid analgesics and antipyretics, not elsewhere classified, accidental (unintentional), initial encounter: Secondary | ICD-10-CM | POA: Diagnosis not present

## 2019-05-16 DIAGNOSIS — T887XXA Unspecified adverse effect of drug or medicament, initial encounter: Secondary | ICD-10-CM | POA: Diagnosis not present

## 2019-05-16 DIAGNOSIS — I10 Essential (primary) hypertension: Secondary | ICD-10-CM | POA: Diagnosis not present

## 2019-05-16 DIAGNOSIS — T50904A Poisoning by unspecified drugs, medicaments and biological substances, undetermined, initial encounter: Secondary | ICD-10-CM | POA: Diagnosis not present

## 2019-05-16 DIAGNOSIS — T50902A Poisoning by unspecified drugs, medicaments and biological substances, intentional self-harm, initial encounter: Secondary | ICD-10-CM

## 2019-05-16 LAB — COMPREHENSIVE METABOLIC PANEL
ALT: 13 U/L (ref 0–44)
AST: 17 U/L (ref 15–41)
Albumin: 4.1 g/dL (ref 3.5–5.0)
Alkaline Phosphatase: 46 U/L (ref 38–126)
Anion gap: 9 (ref 5–15)
BUN: 8 mg/dL (ref 6–20)
CO2: 22 mmol/L (ref 22–32)
Calcium: 8.8 mg/dL — ABNORMAL LOW (ref 8.9–10.3)
Chloride: 105 mmol/L (ref 98–111)
Creatinine, Ser: 0.92 mg/dL (ref 0.44–1.00)
GFR calc Af Amer: >60 mL/min (ref >60)
GFR calc non Af Amer: >60 mL/min (ref >60)
Glucose, Bld: 95 mg/dL (ref 70–99)
Potassium: 3.8 mmol/L (ref 3.5–5.1)
Sodium: 136 mmol/L (ref 135–145)
Total Bilirubin: 2.3 mg/dL — ABNORMAL HIGH (ref 0.3–1.2)
Total Protein: 7.4 g/dL (ref 6.5–8.1)

## 2019-05-16 LAB — CBC WITH DIFFERENTIAL/PLATELET
Abs Immature Granulocytes: 0.01 10*3/uL (ref 0.00–0.07)
Basophils Absolute: 0 10*3/uL (ref 0.0–0.1)
Basophils Relative: 0 %
Eosinophils Absolute: 0 10*3/uL (ref 0.0–0.5)
Eosinophils Relative: 1 %
HCT: 38.8 % (ref 36.0–46.0)
Hemoglobin: 12.6 g/dL (ref 12.0–15.0)
Immature Granulocytes: 0 %
Lymphocytes Relative: 39 %
Lymphs Abs: 1.9 10*3/uL (ref 0.7–4.0)
MCH: 30.7 pg (ref 26.0–34.0)
MCHC: 32.5 g/dL (ref 30.0–36.0)
MCV: 94.6 fL (ref 80.0–100.0)
Monocytes Absolute: 0.3 10*3/uL (ref 0.1–1.0)
Monocytes Relative: 5 %
Neutro Abs: 2.6 10*3/uL (ref 1.7–7.7)
Neutrophils Relative %: 55 %
Platelets: 204 10*3/uL (ref 150–400)
RBC: 4.1 MIL/uL (ref 3.87–5.11)
RDW: 12.2 % (ref 11.5–15.5)
WBC: 4.8 10*3/uL (ref 4.0–10.5)
nRBC: 0 % (ref 0.0–0.2)

## 2019-05-16 LAB — RAPID URINE DRUG SCREEN, HOSP PERFORMED
Amphetamines: NOT DETECTED
Barbiturates: NOT DETECTED
Benzodiazepines: NOT DETECTED
Cocaine: NOT DETECTED
Opiates: NOT DETECTED
Tetrahydrocannabinol: POSITIVE — AB

## 2019-05-16 LAB — HCG, QUANTITATIVE, PREGNANCY: hCG, Beta Chain, Quant, S: <1 m[IU]/mL (ref <5)

## 2019-05-16 LAB — PROTIME-INR
INR: 1.8 — ABNORMAL HIGH (ref 0.8–1.2)
Prothrombin Time: 20.7 seconds — ABNORMAL HIGH (ref 11.4–15.2)

## 2019-05-16 LAB — ACETAMINOPHEN LEVEL
Acetaminophen (Tylenol), Serum: 82 ug/mL — ABNORMAL HIGH (ref 10–30)
Acetaminophen (Tylenol), Serum: 94 ug/mL — ABNORMAL HIGH (ref 10–30)

## 2019-05-16 LAB — LACTIC ACID, PLASMA: Lactic Acid, Venous: 1.6 mmol/L (ref 0.5–1.9)

## 2019-05-16 LAB — CBG MONITORING, ED: Glucose-Capillary: 78 mg/dL (ref 70–99)

## 2019-05-16 LAB — ETHANOL: Alcohol, Ethyl (B): <10 mg/dL (ref <10)

## 2019-05-16 LAB — SALICYLATE LEVEL: Salicylate Lvl: <7 mg/dL — ABNORMAL LOW (ref 7.0–30.0)

## 2019-05-16 MED ORDER — SODIUM CHLORIDE 0.9 % IV BOLUS
1000.0000 mL | Freq: Once | INTRAVENOUS | Status: AC
  Administered 2019-05-16: 08:00:00 1000 mL via INTRAVENOUS

## 2019-05-16 NOTE — ED Notes (Signed)
RN attempted to call patient's sister to update her on patient's status with no answer

## 2019-05-16 NOTE — Progress Notes (Signed)
Received Christina Gill at the change of shift awake in her bed watching TV. She woke up in the middle of the night and demanding to go home. She stated her overdose attempt was a mistake.  The IVC process was explained to her and she agreed to stay in the hospital.

## 2019-05-16 NOTE — BH Assessment (Signed)
Per Marciano Sequin, NP, patient meets criteria for inpatient treatment. Patient referred to the following hospitals for review and consideration of a bed:  [facility]   [facility]  [facility]  [facility]  [facility]  [facility]  [facility]  [facility]  [facility]  [facility]  [facility]  [facility]  [facility]  [facility]

## 2019-05-16 NOTE — ED Triage Notes (Signed)
EMS reports Pt took "handful of Tylenol PM" this morning at 0500 due to not sleeping well, called mother and mother called EMS. Pt  Alert and oriented and answers questions appropriately, states she is sleepy.  BP 150/102 HR 130 RR 18 Spo2 100 CBG 98  18 LAC

## 2019-05-16 NOTE — ED Provider Notes (Signed)
[facility] DEPT Provider Note   CSN: [id number] Arrival date & time: 05/16/19  1517     History Chief Complaint  Patient presents with  . Ingestion    Christina Gill is a 24 y.o. female.  HPI   24 year old female with intentional overdose.  Took "a Tylenol PM" this morning.  She is unable to quantify beyond taking "a handful."  She took them at approximately 5 AM.  She states that she was just trying to sleep.  She cannot provide me with a good answer as to why she took so many though or subsequently called her mother after she took them.  She denies that she was trying to harm herself.  She denies any other ingestion.  Denies any regular medications or over-the-counter's aside from aforementioned.  She states that she currently feels tired.  No other acute complaints.  Past Medical History:  Diagnosis Date  . Chlamydia     There are no problems to display for this patient.   History reviewed. No pertinent surgical history.   OB History   No obstetric history on file.     History reviewed. No pertinent family history.  Social History   Tobacco Use  . Smoking status: Never Smoker  . Smokeless tobacco: Never Used  Substance Use Topics  . Alcohol use: Yes    Comment: occasional  . Drug use: Yes    Frequency: 7.0 times per week    Types: Marijuana    Home Medications Prior to Admission medications   Medication Sig Start Date End Date Taking? Authorizing Provider  ibuprofen (ADVIL,MOTRIN) 400 MG tablet Take 1 tablet (400 mg total) by mouth every 8 (eight) hours as needed. 02/16/16   Jola Schmidt, MD  metroNIDAZOLE (FLAGYL) 500 MG tablet Take 1 tablet (500 mg total) 2 (two) times daily by mouth. 01/19/17   Vanessa Kick, MD    Allergies    Patient has no known allergies.  Review of Systems   Review of Systems All systems reviewed and negative, other than as noted in HPI.  Physical Exam Updated Vital Signs BP (!) 172/103    Pulse (!) 130   Temp 98 F (36.7 C)   Resp 20   SpO2 100%   Physical Exam Vitals and nursing note reviewed.  Constitutional:      General: She is not in acute distress.    Appearance: She is well-developed.     Comments: Laying on Biomedical scientist.  No acute distress.  Appears to be sleeping.  HENT:     Head: Normocephalic and atraumatic.  Eyes:     General:        Right eye: No discharge.        Left eye: No discharge.     Conjunctiva/sclera: Conjunctivae normal.  Cardiovascular:     Rate and Rhythm: Regular rhythm. Tachycardia present.     Heart sounds: Normal heart sounds. No murmur. No friction rub. No gallop.   Pulmonary:     Effort: Pulmonary effort is normal. No respiratory distress.     Breath sounds: Normal breath sounds.  Abdominal:     General: There is no distension.     Palpations: Abdomen is soft.     Tenderness: There is no abdominal tenderness.  Musculoskeletal:        General: No tenderness.     Cervical back: Neck supple.  Skin:    General: Skin is warm and dry.  Neurological:     Mental Status:  She is alert.  Psychiatric:     Comments: Speech is soft.  She is answering questions appropriately but does not open her eyes when speaking.  Following commands.  No focal deficits noted.     ED Results / Procedures / Treatments   Labs (all labs ordered are listed, but only abnormal results are displayed) Labs Reviewed  ACETAMINOPHEN LEVEL - Abnormal; Notable for the following components:      Result Value   Acetaminophen (Tylenol), Serum 82 (*)    All other components within normal limits  COMPREHENSIVE METABOLIC PANEL - Abnormal; Notable for the following components:   Calcium 8.8 (*)    Total Bilirubin 2.3 (*)    All other components within normal limits  RAPID URINE DRUG SCREEN, HOSP PERFORMED - Abnormal; Notable for the following components:   Tetrahydrocannabinol POSITIVE (*)    All other components within normal limits  SALICYLATE LEVEL - Abnormal;  Notable for the following components:   Salicylate Lvl <7.0 (*)    All other components within normal limits  ACETAMINOPHEN LEVEL - Abnormal; Notable for the following components:   Acetaminophen (Tylenol), Serum 94 (*)    All other components within normal limits  PROTIME-INR - Abnormal; Notable for the following components:   Prothrombin Time 20.7 (*)    INR 1.8 (*)    All other components within normal limits  RESPIRATORY PANEL BY RT PCR (FLU A&B, COVID)  CBC WITH DIFFERENTIAL/PLATELET  ETHANOL  LACTIC ACID, PLASMA  HCG, QUANTITATIVE, PREGNANCY  CBG MONITORING, ED    EKG None  Radiology No results found.  Procedures Procedures (including critical care time)  CRITICAL CARE Performed by: Raeford Razor Total critical care time: 35 minutes Critical care time was exclusive of separately billable procedures and treating other patients. Critical care was necessary to treat or prevent imminent or life-threatening deterioration. Critical care was time spent personally by me on the following activities: development of treatment plan with patient and/or surrogate as well as nursing, discussions with consultants, evaluation of patient's response to treatment, examination of patient, obtaining history from patient or surrogate, ordering and performing treatments and interventions, ordering and review of laboratory studies, ordering and review of radiographic studies, pulse oximetry and re-evaluation of patient's condition.   Medications Ordered in ED Medications  sodium chloride 0.9 % bolus 1,000 mL (has no administration in time range)    ED Course  I have reviewed the triage vital signs and the nursing notes.  Pertinent labs & imaging results that were available during my care of the patient were reviewed by me and considered in my medical decision making (see chart for details).    MDM Rules/Calculators/A&P                      24 year old female with intentional drug  overdose.  She states that she was not trying to harm her self.  Time of ingestion approximately 0500 today.  Outside the window that charcoal would likely make much of a difference.  She denies any other ingestion.  She is tachycardic.  This is probably the vagolytic effect of the diphenhydramine that I assume was an this medication.  She does not appear to be ill. She is protecting her airway. We will continue to monitor. She has IV access. Check EKG and labs.  Will need to pay close attention to the 4-hour acetaminophen level.  Even when to medically clear I think it would be appropriate to have TTS speak with  her. This was potentially a large quantity of pills and the fact that she called her mother afterwards is not consistent with her taking these medications solely for the purpose of trying to go to sleep.  9:56 AM 4 hours acetaminophen level is below treatment line but up from the level drawn shortly before. Her INR is also 1.8 which I don't like. Will contact poison control, re: recommendations for possible initiation of NAC.   10:23 AM  Poison control notified, lab values and vitals given. Poison control states lab values not within toxic limits and as long as discretionary vital monitoring remains within normal limits, they will close this file. Pt has no additional complaints. Abdominal exam remains benign. Tachycardia improving.   Final Clinical Impression(s) / ED Diagnoses Final diagnoses:  Intentional drug overdose, initial encounter [facility])    Rx / DC Orders ED Discharge Orders    None       Raeford Razor, MD 05/16/19 1409

## 2019-05-16 NOTE — Progress Notes (Signed)
Pt accepted to [facility]; [employer]. Room to be determined.      Dr. Estill Cotta is the accepting and attending physician.   Call report to [phone].     Beth @ [facility] ED notified.    Pt is Voluntary.    Pt may be transported by [employer].   Pt scheduled to arrive on 05/17/19 after 7:00am.   Drucilla Schmidt, MSW, LCSW-A Clinical Disposition Social Worker [facility]/TTS [phone]

## 2019-05-16 NOTE — ED Notes (Signed)
Poison control notified, Lab values and vitals given. Poison control states levels not within toxic range, as long as vitals remain stable they have closed this file.

## 2019-05-16 NOTE — ED Notes (Signed)
Provider notified of Poison control consult.

## 2019-05-16 NOTE — BH Assessment (Addendum)
Tele Assessment Note   Patient Name: Christina Gill MRN: [id number] Referring Physician: Juleen China Location of Patient: [facility] Location of Provider: Behavioral Health TTS Department  Christina Gill is an 24 y.o. female who presented to [facility] after having ingested a handful of Tylenol PM pills is a possible suicide attempt.  Patient is a very poor historian.  She is unable to tell this Clinical research associate how many pills that sh actually took.  Patient denies SI and states that she was just taking the pills to sleep.  TTS asked her why she took so many knowing it could be dangerous and she did not answer.  Patient denies any prior suicide attempts and states that she has never had no psychiatric care on an inpatient or outpatient basis.  When asked if he has any stressors with school, relationships or family, she states that she does not have any and that everything was fine.  Asked her if she was having relationship problems and she states that she is currently not in a relationship. She denies HI/Psychosis. Patient denied drug use to this Clinical research associate, but informed ED staff that she smokes marijuana daily. Patient states that she has been experiencing a decreased appetite.  She denies a history of abuse or self-mutilation.  Patient states that she feels depressed and alone at times.  Patient states that she is from [place].  She states that her parents live there.  She states that she does not have a close relationship with them. Patient states that she has a few friends who are supportive.  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  She states that she is doing well in school.  Patient denies any legal involvement.  Patient was not very forthcoming with information.  Her eye contact was fair to poor, but she spoke very little when asked questions.  Her judgment, insight and impulse control appear to be impaired.  Her thoughts were organized and her memory intact.  She  did not appear to be responding to any internal stimuli.  Her mood appeared to be depressed and her affect was flat. .  Diagnosis: F32.2 MDD Single Episode Severe  Past Medical History:  Past Medical History:  Diagnosis Date  . Chlamydia     History reviewed. No pertinent surgical history.  Family History: History reviewed. No pertinent family history.  Social History:  reports that she has never smoked. She has never used smokeless tobacco. She reports current alcohol use. She reports current drug use. Frequency: 7.00 times per week. Drug: Marijuana.  Additional Social History:  Alcohol / Drug Use Pain Medications: see MAR Prescriptions: see MAR Over the Counter: see MAR History of alcohol / drug use?: Yes Longest period of sobriety (when/how long): none reported Substance #1 Name of Substance 1: marijuana 1 - Age of First Use: UTA 1 - Amount (size/oz): UTA 1 - Frequency: daily 1 - Duration: UTA 1 - Last Use / Amount: UTA Substance #2 Name of Substance 2: alcohol 2 - Age of First Use: UTA 2 - Amount (size/oz): UTA 2 - Frequency: occasional 2 - Duration: UTA 2 - Last Use / Amount: UTA  CIWA: CIWA-Ar BP: (!) 139/105 Pulse Rate: 84 COWS:    Allergies: No Known Allergies  Home Medications: (Not in a hospital admission)   OB/GYN Status:  No LMP recorded. (Menstrual status: IUD).  General Assessment Data [facility] of Assessment: [facility] TTS Assessment: In system Is this a Tele or Face-to-Face Assessment?: Tele Assessment  Is this an Initial Assessment or a Re-assessment for this encounter?: Initial Assessment Patient Accompanied by:: N/A Language Other than English: No Living Arrangements: Other (Comment)(lives in a dornm with roomate) What gender do you identify as?: Female Marital status: Single Maiden name: Gwynneth Macleod Pregnancy Status: No Living Arrangements: Other (Comment)(dorm situation) Can pt return to current living arrangement?: Yes Admission  Status: Voluntary Is patient capable of signing voluntary admission?: Yes Referral Source: Self/Family/Friend Insurance type: BCBS     Crisis Care Plan Living Arrangements: Other (Comment)(dorm situation) Legal Guardian: Other:(self) Name of Psychiatrist: none Name of Therapist: none  Education Status Is patient currently in school?: Yes Name of school: [employer]  Risk to self with the past 6 months Suicidal Ideation: Yes-Currently Present Has patient been a risk to self within the past 6 months prior to admission? : No Suicidal Intent: Yes-Currently Present Has patient had any suicidal intent within the past 6 months prior to admission? : No Is patient at risk for suicide?: Yes Suicidal Plan?: Yes-Currently Present Has patient had any suicidal plan within the past 6 months prior to admission? : No Specify Current Suicidal Plan: OD on Tylenol Access to Means: Yes Specify Access to Suicidal Means: Tylenol Pills What has been your use of drugs/alcohol within the last 12 months?: daily THC Previous Attempts/Gestures: No How many times?: 0 Other Self Harm Risks: (unknown) Triggers for Past Attempts: None known Intentional Self Injurious Behavior: None Family Suicide History: No Recent stressful life event(s): Other (Comment)(patient denies stressors) Persecutory voices/beliefs?: No Depression: Yes Depression Symptoms: Despondent, Insomnia, Isolating, Loss of interest in usual pleasures Substance abuse history and/or treatment for substance abuse?: Yes Suicide prevention information given to non-admitted patients: Not applicable  Risk to Others within the past 6 months Homicidal Ideation: No Does patient have any lifetime risk of violence toward others beyond the six months prior to admission? : No Thoughts of Harm to Others: No Current Homicidal Intent: No Current Homicidal Plan: No Access to Homicidal Means: No Identified Victim: none History of harm to others?:  No Assessment of Violence: None Noted Violent Behavior Description: none Does patient have access to weapons?: No Criminal Charges Pending?: No Does patient have a court date: No Is patient on probation?: No  Psychosis Hallucinations: None noted Delusions: None noted  Mental Status Report Appearance/Hygiene: Unremarkable Eye Contact: Poor Motor Activity: Restlessness Speech: Logical/coherent Level of Consciousness: Alert Mood: Depressed Affect: Flat Anxiety Level: None Thought Processes: Coherent, Relevant Judgement: Impaired Orientation: Person, Place, Time, Situation Obsessive Compulsive Thoughts/Behaviors: None  Cognitive Functioning Concentration: Normal Memory: Recent Intact, Remote Intact Is patient IDD: No Insight: Poor Impulse Control: Poor Appetite: Fair Have you had any weight changes? : No Change Sleep: Decreased Total Hours of Sleep: 3 Vegetative Symptoms: None  ADLScreening [facility] Assessment Services) Patient's cognitive ability adequate to safely complete daily activities?: Yes Patient able to express need for assistance with ADLs?: Yes Independently performs ADLs?: Yes (appropriate for developmental age)  Prior Inpatient Therapy Prior Inpatient Therapy: No  Prior Outpatient Therapy Prior Outpatient Therapy: No Does patient have an ACCT team?: No Does patient have Intensive In-House Services?  : No Does patient have [facility] services? : No Does patient have P4CC services?: No  ADL Screening (condition at time of admission) Patient's cognitive ability adequate to safely complete daily activities?: Yes Is the patient deaf or have difficulty hearing?: No Does the patient have difficulty seeing, even when wearing glasses/contacts?: No Does the patient have difficulty concentrating, remembering, or making decisions?:  No Patient able to express need for assistance with ADLs?: Yes Does the patient have difficulty dressing or bathing?: No Independently  performs ADLs?: Yes (appropriate for developmental age) Does the patient have difficulty walking or climbing stairs?: No Weakness of Legs: None Weakness of Arms/Hands: None  Home Assistive Devices/Equipment Home Assistive Devices/Equipment: None  Therapy Consults (therapy consults require a physician order) PT Evaluation Needed: No OT Evalulation Needed: No SLP Evaluation Needed: No Abuse/Neglect Assessment (Assessment to be complete while patient is alone) Abuse/Neglect Assessment Can Be Completed: Yes Physical Abuse: Denies Verbal Abuse: Denies Sexual Abuse: Denies Exploitation of patient/patient's resources: Denies Self-Neglect: Denies Values / Beliefs Cultural Requests During Hospitalization: None Spiritual Requests During Hospitalization: None Consults Spiritual Care Consult Needed: No Transition of Care Team Consult Needed: No Advance Directives (For Healthcare) Does Patient Have a Medical Advance Directive?: No Would patient like information on creating a medical advance directive?: No - Patient declined Nutrition Screen- [facility] Has the patient recently lost weight without trying?: No Has the patient been eating poorly because of a decreased appetite?: No Malnutrition Screening Tool Score: 0        Disposition: Per Harriett Sine, NP, Inpatient Treatment is recommended Disposition Initial Assessment Completed for this Encounter: Yes  This service was provided via telemedicine using a 2-way, interactive audio and video technology.  Names of all persons participating in this telemedicine service and their role in this encounter. Name: Christina Gill Role: patient  Name: Mickayla Trouten Role: TTS  Name:  Role:   Name:  Role:     Reatha Armour 05/16/2019 1:58 PM

## 2019-05-16 NOTE — ED Notes (Signed)
Pt to room 40. Pt calm, drowsy. Pt oriented to unit. Pt resting in bed comfortably.

## 2019-05-16 NOTE — BH Assessment (Signed)
[facility] Assessment Progress Note    Per Marciano Sequin, NP, Inpatient Treatment is recommended

## 2019-05-17 LAB — COMPREHENSIVE METABOLIC PANEL
ALT: 18 U/L (ref 0–44)
AST: 21 U/L (ref 15–41)
Albumin: 4.7 g/dL (ref 3.5–5.0)
Alkaline Phosphatase: 48 U/L (ref 38–126)
Anion gap: 9 (ref 5–15)
BUN: 7 mg/dL (ref 6–20)
CO2: 23 mmol/L (ref 22–32)
Calcium: 9.3 mg/dL (ref 8.9–10.3)
Chloride: 104 mmol/L (ref 98–111)
Creatinine, Ser: 0.91 mg/dL (ref 0.44–1.00)
GFR calc Af Amer: >60 mL/min (ref >60)
GFR calc non Af Amer: >60 mL/min (ref >60)
Glucose, Bld: 88 mg/dL (ref 70–99)
Potassium: 3.6 mmol/L (ref 3.5–5.1)
Sodium: 136 mmol/L (ref 135–145)
Total Bilirubin: 2.6 mg/dL — ABNORMAL HIGH (ref 0.3–1.2)
Total Protein: 8.2 g/dL — ABNORMAL HIGH (ref 6.5–8.1)

## 2019-05-17 LAB — PROTIME-INR
INR: 1.2 (ref 0.8–1.2)
Prothrombin Time: 14.8 seconds (ref 11.4–15.2)

## 2019-05-17 LAB — ACETAMINOPHEN LEVEL: Acetaminophen (Tylenol), Serum: <10 ug/mL — ABNORMAL LOW (ref 10–30)

## 2019-05-17 NOTE — ED Notes (Signed)
Attempted to call report to [facility] and stayed on hold for seven minutes.  Will call back

## 2019-05-17 NOTE — ED Provider Notes (Addendum)
1:29 AM  Pt here with intentional Tylenol overdose.  Recommended for inpatient psychiatric treatment.  Patient initially voluntary but now asking to leave.  Nurse unable to redirect.  Will place her under involuntary commitment.  I feel she is a danger to herself given her significant Tylenol overdose.  I reviewed all nursing notes and pertinent previous records as available.  I have interpreted any EKGs, lab and urine results, imaging (as available).    Mikhia Dusek, Layla Maw, DO 05/17/19 0130   1:35 AM  Now nursing staff reports that patient agrees to stay voluntarily as she does not want IVC.  Will hold IVC papers at this time.  Secretary currently has them in case they are needed.  Recommended for [facility] in the morning.    Anis Degidio, Layla Maw, DO 05/17/19 [phone]

## 2019-05-17 NOTE — ED Notes (Signed)
[facility] called .  They will be here next shift.

## 2019-05-17 NOTE — Consult Note (Signed)
  Christina Gill, 24 y.o., female patient presented to [facility] Emergency Department for evaluation of intentional Tylenol overdose.  She continues to meet inpatient criteria and has been accepted at [facility], pending transport this AM.     Spoke with Dr. Madilyn Hook; informed of above recommendation and disposition

## 2019-05-17 NOTE — ED Notes (Signed)
Patients mother called and stated she did not want her daughter put on medications.  I explained that I didn't have any control over what They do at [facility].  I told her I would make a note of it.  Patient is still very reluctant to go but she is under IVC.

## 2019-05-18 DIAGNOSIS — F322 Major depressive disorder, single episode, severe without psychotic features: Secondary | ICD-10-CM | POA: Diagnosis not present

## 2019-05-18 DIAGNOSIS — R45851 Suicidal ideations: Secondary | ICD-10-CM | POA: Diagnosis not present

## 2019-05-18 DIAGNOSIS — Z62810 Personal history of physical and sexual abuse in childhood: Secondary | ICD-10-CM | POA: Diagnosis not present

## 2019-05-18 DIAGNOSIS — F332 Major depressive disorder, recurrent severe without psychotic features: Secondary | ICD-10-CM | POA: Diagnosis not present

## 2019-07-09 DIAGNOSIS — Z9189 Other specified personal risk factors, not elsewhere classified: Secondary | ICD-10-CM | POA: Diagnosis not present

## 2019-07-09 DIAGNOSIS — R1011 Right upper quadrant pain: Secondary | ICD-10-CM | POA: Diagnosis not present

## 2019-07-12 DIAGNOSIS — Z9189 Other specified personal risk factors, not elsewhere classified: Secondary | ICD-10-CM | POA: Diagnosis not present

## 2019-07-12 DIAGNOSIS — R1011 Right upper quadrant pain: Secondary | ICD-10-CM | POA: Diagnosis not present

## 2019-07-12 DIAGNOSIS — R109 Unspecified abdominal pain: Secondary | ICD-10-CM | POA: Diagnosis not present

## 2019-10-09 ENCOUNTER — Ambulatory Visit (HOSPITAL_COMMUNITY)
Admission: EM | Admit: 2019-10-09 | Discharge: 2019-10-09 | Disposition: A | Discharge status: Home / Self Care | Payer: BC Managed Care – PPO | Attending: Family Medicine | Admitting: Family Medicine

## 2019-10-09 ENCOUNTER — Other Ambulatory Visit: Payer: Self-pay

## 2019-10-09 DIAGNOSIS — R110 Nausea: Secondary | ICD-10-CM | POA: Insufficient documentation

## 2019-10-09 DIAGNOSIS — Z20822 Contact with and (suspected) exposure to covid-19: Secondary | ICD-10-CM | POA: Diagnosis not present

## 2019-10-09 DIAGNOSIS — R109 Unspecified abdominal pain: Secondary | ICD-10-CM | POA: Diagnosis not present

## 2019-10-09 DIAGNOSIS — R197 Diarrhea, unspecified: Secondary | ICD-10-CM

## 2019-10-09 DIAGNOSIS — Z3202 Encounter for pregnancy test, result negative: Secondary | ICD-10-CM

## 2019-10-09 DIAGNOSIS — K529 Noninfective gastroenteritis and colitis, unspecified: Secondary | ICD-10-CM | POA: Diagnosis not present

## 2019-10-09 LAB — POCT URINALYSIS DIP (DEVICE)
Glucose, UA: NEGATIVE mg/dL
Ketones, ur: 40 mg/dL — AB
Leukocytes,Ua: NEGATIVE
Nitrite: NEGATIVE
Protein, ur: NEGATIVE mg/dL
Specific Gravity, Urine: >=1.03 (ref 1.005–1.030)
Urobilinogen, UA: 0.2 mg/dL (ref 0.0–1.0)
pH: 5.5 (ref 5.0–8.0)

## 2019-10-09 LAB — POC URINE PREG, ED: Preg Test, Ur: NEGATIVE

## 2019-10-09 LAB — SARS CORONAVIRUS 2 (TAT 6-24 HRS): SARS Coronavirus 2: NEGATIVE

## 2019-10-09 MED ORDER — GLIPIZIDE 10 MG PO TABS: 10.0000 mg | ORAL_TABLET | Freq: Every day | ORAL | 1 refills | Status: DC

## 2019-10-09 MED ORDER — LOPERAMIDE HCL 2 MG PO CAPS: 2.0000 mg | ORAL_CAPSULE | Freq: Four times a day (QID) | ORAL | 0 refills | Status: DC | PRN

## 2019-10-09 MED ORDER — ONDANSETRON 8 MG PO TBDP: 8.0000 mg | ORAL_TABLET | Freq: Three times a day (TID) | ORAL | 0 refills | Status: DC | PRN

## 2019-10-09 NOTE — ED Provider Notes (Signed)
Error    Mardella Layman, MD 10/09/19 [phone]

## 2019-10-09 NOTE — ED Triage Notes (Signed)
Pt c/o epigastric abdominal pain and diarrhea since Sunday

## 2019-10-09 NOTE — Discharge Instructions (Addendum)

## 2019-10-09 NOTE — ED Provider Notes (Signed)
[facility]   MRN: [id number] DOB: 01-01-1996  Subjective:   Christina Gill is a 24 y.o. female presenting for 3-day history of nausea without vomiting, belly pain, diarrhea.  Patient has been having 5-8 bowel movements per day.  Denies any recent antibiotic use, travel, hospitalizations.  Denies any sick contacts.  Patient works in [employer]. Has not had COVID vaccination. No loss of sense of taste and smell. LMP 09/24/2019, was regular.    No current facility-administered medications for this encounter. No current outpatient medications on file.   No Known Allergies  Past Medical History:  Diagnosis Date  . Chlamydia      No past surgical history on file.  No family history on file.  Social History   Tobacco Use  . Smoking status: Never Smoker  . Smokeless tobacco: Never Used  Substance Use Topics  . Alcohol use: Yes    Comment: occasional  . Drug use: Yes    Frequency: 7.0 times per week    Types: Marijuana    Comment: smokes daily    ROS   Objective:   Vitals: BP 125/75   Pulse 84   Temp 98.4 F (36.9 C) (Oral)   Resp 16   LMP 09/24/2019   SpO2 97%   Physical Exam Constitutional:      General: She is not in acute distress.    Appearance: Normal appearance. She is well-developed and normal weight. She is not ill-appearing, toxic-appearing or diaphoretic.  HENT:     Head: Normocephalic and atraumatic.     Right Ear: External ear normal.     Left Ear: External ear normal.     Nose: Nose normal.     Mouth/Throat:     Mouth: Mucous membranes are moist.     Pharynx: Oropharynx is clear.  Eyes:     General: No scleral icterus.    Extraocular Movements: Extraocular movements intact.     Pupils: Pupils are equal, round, and reactive to light.  Cardiovascular:     Rate and Rhythm: Normal rate and regular rhythm.     Pulses: Normal pulses.     Heart sounds: Normal heart sounds. No murmur heard.  No friction rub. No gallop.    Pulmonary:     Effort: Pulmonary effort is normal. No respiratory distress.     Breath sounds: Normal breath sounds. No stridor. No wheezing, rhonchi or rales.  Abdominal:     General: Bowel sounds are normal. There is no distension.     Palpations: Abdomen is soft. There is no mass.     Tenderness: There is generalized abdominal tenderness. There is no right CVA tenderness, left CVA tenderness, guarding or rebound.     Comments: Hyperactive bowel sounds.  Skin:    General: Skin is warm and dry.     Coloration: Skin is not pale.     Findings: No rash.  Neurological:     General: No focal deficit present.     Mental Status: She is alert and oriented to person, place, and time.  Psychiatric:        Mood and Affect: Mood normal.        Behavior: Behavior normal.        Thought Content: Thought content normal.        Judgment: Judgment normal.      Assessment and Plan :   PDMP not reviewed this encounter.  1. Gastroenteritis   2. Diarrhea, unspecified type   3. Nausea  Will manage for suspected viral gastroenteritis with supportive care.  Recommended patient hydrate well, eat light meals and maintain electrolytes.  Will use Zofran and Imodium for nausea, vomiting and diarrhea. Counseled patient on potential for adverse effects with medications prescribed/recommended today, ER and return-to-clinic precautions discussed, patient verbalized understanding.    Wallis Bamberg, [place] 10/09/19 [phone]

## 2020-05-22 ENCOUNTER — Emergency Department (HOSPITAL_COMMUNITY): Payer: BC Managed Care – PPO

## 2020-05-22 ENCOUNTER — Encounter (HOSPITAL_COMMUNITY): Payer: Self-pay | Admitting: Pharmacy Technician

## 2020-05-22 ENCOUNTER — Other Ambulatory Visit: Payer: Self-pay

## 2020-05-22 ENCOUNTER — Emergency Department (HOSPITAL_COMMUNITY)
Admission: EM | Admit: 2020-05-22 | Discharge: 2020-05-22 | Disposition: A | Discharge status: Home / Self Care | Payer: BC Managed Care – PPO | Attending: Emergency Medicine | Admitting: Emergency Medicine

## 2020-05-22 DIAGNOSIS — S0003XA Contusion of scalp, initial encounter: Secondary | ICD-10-CM | POA: Diagnosis not present

## 2020-05-22 DIAGNOSIS — R55 Syncope and collapse: Secondary | ICD-10-CM | POA: Insufficient documentation

## 2020-05-22 DIAGNOSIS — S0011XA Contusion of right eyelid and periocular area, initial encounter: Secondary | ICD-10-CM | POA: Insufficient documentation

## 2020-05-22 DIAGNOSIS — S0990XA Unspecified injury of head, initial encounter: Secondary | ICD-10-CM

## 2020-05-22 DIAGNOSIS — R569 Unspecified convulsions: Secondary | ICD-10-CM | POA: Diagnosis not present

## 2020-05-22 LAB — CBC
HCT: 35.8 % — ABNORMAL LOW (ref 36.0–46.0)
Hemoglobin: 12.3 g/dL (ref 12.0–15.0)
MCH: 31.1 pg (ref 26.0–34.0)
MCHC: 34.4 g/dL (ref 30.0–36.0)
MCV: 90.6 fL (ref 80.0–100.0)
Platelets: 239 10*3/uL (ref 150–400)
RBC: 3.95 MIL/uL (ref 3.87–5.11)
RDW: 12.3 % (ref 11.5–15.5)
WBC: 9.1 10*3/uL (ref 4.0–10.5)
nRBC: 0 % (ref 0.0–0.2)

## 2020-05-22 LAB — BASIC METABOLIC PANEL
Anion gap: 10 (ref 5–15)
BUN: 8 mg/dL (ref 6–20)
CO2: 21 mmol/L — ABNORMAL LOW (ref 22–32)
Calcium: 9.2 mg/dL (ref 8.9–10.3)
Chloride: 104 mmol/L (ref 98–111)
Creatinine, Ser: 0.89 mg/dL (ref 0.44–1.00)
GFR, Estimated: >60 mL/min (ref >60)
Glucose, Bld: 71 mg/dL (ref 70–99)
Potassium: 3.5 mmol/L (ref 3.5–5.1)
Sodium: 135 mmol/L (ref 135–145)

## 2020-05-22 LAB — I-STAT BETA HCG BLOOD, ED (MC, WL, AP ONLY): I-stat hCG, quantitative: <5 m[IU]/mL (ref <5)

## 2020-05-22 MED ORDER — ACETAMINOPHEN 325 MG PO TABS: 650.0000 mg | ORAL_TABLET | Freq: Once | ORAL | Status: DC
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
## 2020-06-11 ENCOUNTER — Other Ambulatory Visit: Payer: Self-pay

## 2020-06-11 ENCOUNTER — Encounter (HOSPITAL_COMMUNITY): Payer: Self-pay

## 2020-06-11 ENCOUNTER — Emergency Department (HOSPITAL_COMMUNITY)
Admission: EM | Admit: 2020-06-11 | Discharge: 2020-06-11 | Disposition: A | Discharge status: Home / Self Care | Payer: BC Managed Care – PPO | Attending: Emergency Medicine | Admitting: Emergency Medicine

## 2020-06-11 DIAGNOSIS — Z87820 Personal history of traumatic brain injury: Secondary | ICD-10-CM | POA: Insufficient documentation

## 2020-06-11 DIAGNOSIS — R519 Headache, unspecified: Secondary | ICD-10-CM | POA: Insufficient documentation

## 2020-06-11 DIAGNOSIS — G4489 Other headache syndrome: Secondary | ICD-10-CM

## 2020-06-11 MED ORDER — IBUPROFEN 400 MG PO TABS
600.0000 mg | ORAL_TABLET | Freq: Once | ORAL | Status: AC
  Administered 2020-06-11: 600 mg via ORAL
  Filled 2020-06-11: qty 1

## 2020-06-11 NOTE — Discharge Instructions (Signed)
It is recommended that you take ibuprofen 600 mg (3 over-the-counter strength tablets) every 8 hours for headache pain.  You can follow up by calling [facility] neurology to make an appointment if headache persists.
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
## 2020-11-21 ENCOUNTER — Other Ambulatory Visit: Payer: Self-pay

## 2020-11-21 ENCOUNTER — Encounter (HOSPITAL_COMMUNITY): Payer: Self-pay

## 2020-11-21 ENCOUNTER — Emergency Department (HOSPITAL_COMMUNITY)
Admission: EM | Admit: 2020-11-21 | Discharge: 2020-11-21 | Disposition: A | Discharge status: Home / Self Care | Payer: BC Managed Care – PPO | Attending: Emergency Medicine | Admitting: Emergency Medicine

## 2020-11-21 DIAGNOSIS — R1011 Right upper quadrant pain: Secondary | ICD-10-CM | POA: Insufficient documentation

## 2020-11-21 DIAGNOSIS — R300 Dysuria: Secondary | ICD-10-CM

## 2020-11-21 DIAGNOSIS — R350 Frequency of micturition: Secondary | ICD-10-CM | POA: Insufficient documentation

## 2020-11-21 LAB — CBC WITH DIFFERENTIAL/PLATELET
Abs Immature Granulocytes: 0 10*3/uL (ref 0.00–0.07)
Basophils Absolute: 0 10*3/uL (ref 0.0–0.1)
Basophils Relative: 0 %
Eosinophils Absolute: 0.1 10*3/uL (ref 0.0–0.5)
Eosinophils Relative: 2 %
HCT: 35.7 % — ABNORMAL LOW (ref 36.0–46.0)
Hemoglobin: 12 g/dL (ref 12.0–15.0)
Immature Granulocytes: 0 %
Lymphocytes Relative: 36 %
Lymphs Abs: 1.7 10*3/uL (ref 0.7–4.0)
MCH: 30.9 pg (ref 26.0–34.0)
MCHC: 33.6 g/dL (ref 30.0–36.0)
MCV: 92 fL (ref 80.0–100.0)
Monocytes Absolute: 0.4 10*3/uL (ref 0.1–1.0)
Monocytes Relative: 8 %
Neutro Abs: 2.5 10*3/uL (ref 1.7–7.7)
Neutrophils Relative %: 54 %
Platelets: 208 10*3/uL (ref 150–400)
RBC: 3.88 MIL/uL (ref 3.87–5.11)
RDW: 11.9 % (ref 11.5–15.5)
WBC: 4.7 10*3/uL (ref 4.0–10.5)
nRBC: 0 % (ref 0.0–0.2)

## 2020-11-21 LAB — COMPREHENSIVE METABOLIC PANEL
ALT: 23 U/L (ref 0–44)
AST: 23 U/L (ref 15–41)
Albumin: 3.8 g/dL (ref 3.5–5.0)
Alkaline Phosphatase: 42 U/L (ref 38–126)
Anion gap: 6 (ref 5–15)
BUN: 10 mg/dL (ref 6–20)
CO2: 26 mmol/L (ref 22–32)
Calcium: 9 mg/dL (ref 8.9–10.3)
Chloride: 106 mmol/L (ref 98–111)
Creatinine, Ser: 0.94 mg/dL (ref 0.44–1.00)
GFR, Estimated: >60 mL/min (ref >60)
Glucose, Bld: 89 mg/dL (ref 70–99)
Potassium: 4.1 mmol/L (ref 3.5–5.1)
Sodium: 138 mmol/L (ref 135–145)
Total Bilirubin: 1.1 mg/dL (ref 0.3–1.2)
Total Protein: 6.7 g/dL (ref 6.5–8.1)

## 2020-11-21 LAB — URINALYSIS, MICROSCOPIC (REFLEX): WBC, UA: >50 WBC/hpf (ref 0–5)

## 2020-11-21 LAB — URINALYSIS, ROUTINE W REFLEX MICROSCOPIC
Bilirubin Urine: NEGATIVE
Glucose, UA: NEGATIVE mg/dL
Ketones, ur: NEGATIVE mg/dL
Nitrite: NEGATIVE
Protein, ur: NEGATIVE mg/dL
Specific Gravity, Urine: 1.01 (ref 1.005–1.030)
pH: 6.5 (ref 5.0–8.0)

## 2020-11-21 LAB — PREGNANCY, URINE: Preg Test, Ur: NEGATIVE

## 2020-11-21 LAB — LIPASE, BLOOD: Lipase: 30 U/L (ref 11–51)

## 2020-11-21 MED ORDER — PHENAZOPYRIDINE HCL 200 MG PO TABS: 200.0000 mg | ORAL_TABLET | Freq: Three times a day (TID) | ORAL | 0 refills | Status: DC

## 2020-11-21 MED ORDER — CEPHALEXIN 500 MG PO CAPS: 500.0000 mg | ORAL_CAPSULE | Freq: Four times a day (QID) | ORAL | 0 refills | Status: AC

## 2020-11-21 MED ORDER — CEPHALEXIN 250 MG PO CAPS
500.0000 mg | ORAL_CAPSULE | Freq: Once | ORAL | Status: AC
  Administered 2020-11-21: 500 mg via ORAL
  Filled 2020-11-21: qty 2

## 2020-11-21 NOTE — ED Triage Notes (Signed)
Pt reports abd pain, dysuria for the past week. Took an OTC medication for a UTI without relief of her symptoms. Denies any vaginal bleeding or discharge

## 2020-11-21 NOTE — ED Provider Notes (Signed)
[facility] EMERGENCY DEPARTMENT Provider Note   CSN: [id number] Arrival date & time: 11/21/20  3875     History Chief Complaint  Patient presents with   Abdominal Pain   Dysuria    Christina Gill is a 25 y.o. female who presents with concern for 1 week of dysuria, urinary frequency and urgency as well as right upper quadrant abdominal pain.  Endorses recurrent bacterial vaginosis but denies any history of urinary tract infection in the past.  She denies any nausea, vomiting, fevers, chills at home, right upper belly pain is not worsened postprandially.  She has not required any medication at home for her belly pain.  Patient is sexually active with female partners and does not use barrier protection, however denies any new vaginal bleeding or discharge and denies concerns of sexually transmitted infection.  LMP 11/09/2020.  She is not on any contraception.  I personally reviewed this patient's medical records.  She is an otherwise healthy 24 year old female who does not carry medical diagnoses and is not on any medications every day.  HPI     Past Medical History:  Diagnosis Date   Chlamydia     There are no problems to display for this patient.   History reviewed. No pertinent surgical history.   OB History   No obstetric history on file.     No family history on file.  Social History   Tobacco Use   Smoking status: Never   Smokeless tobacco: Never  Substance Use Topics   Alcohol use: Yes    Comment: occasional   Drug use: Yes    Frequency: 7.0 times per week    Types: Marijuana    Comment: smokes daily    Home Medications Prior to Admission medications   Medication Sig Start Date End Date Taking? Authorizing Provider  loperamide (IMODIUM) 2 MG capsule Take 1 capsule (2 mg total) by mouth 4 (four) times daily as needed for diarrhea or loose stools. 10/09/19   Wallis Bamberg, PA-C  ondansetron (ZOFRAN-ODT) 8 MG disintegrating tablet  Take 1 tablet (8 mg total) by mouth every 8 (eight) hours as needed for nausea or vomiting. 10/09/19   Wallis Bamberg, PA-C  glipiZIDE (GLUCOTROL) 10 MG tablet Take 1 tablet (10 mg total) by mouth daily before breakfast. 10/09/19 10/09/19  Mardella Layman, MD    Allergies    Patient has no known allergies.  Review of Systems   Review of Systems  Constitutional: Negative.   HENT: Negative.    Eyes: Negative.   Respiratory: Negative.    Cardiovascular: Negative.   Gastrointestinal:  Positive for abdominal pain. Negative for diarrhea, nausea and vomiting.  Genitourinary:  Positive for dysuria, frequency and urgency. Negative for decreased urine volume, flank pain, hematuria, menstrual problem, pelvic pain, vaginal bleeding, vaginal discharge and vaginal pain.  Musculoskeletal: Negative.   Skin: Negative.   Neurological: Negative.    Physical Exam Updated Vital Signs BP 115/72 (BP Location: Left Arm)   Pulse (!) 58   Temp 98.4 F (36.9 C) (Oral)   Resp 16   Ht 5\' 7"  (1.702 m)   Wt 77.1 kg   LMP 11/09/2020 (Approximate)   SpO2 100%   BMI 26.63 kg/m   Physical Exam Vitals and nursing note reviewed.  Constitutional:      Appearance: She is normal weight. She is not ill-appearing or toxic-appearing.  HENT:     Head: Normocephalic and atraumatic.     Nose: Nose normal.  Mouth/Throat:     Mouth: Mucous membranes are moist.     Pharynx: Oropharynx is clear. Uvula midline. No oropharyngeal exudate or posterior oropharyngeal erythema.     Tonsils: No tonsillar exudate.  Eyes:     General: Lids are normal. Vision grossly intact.        Right eye: No discharge.        Left eye: No discharge.     Extraocular Movements: Extraocular movements intact.     Conjunctiva/sclera: Conjunctivae normal.     Pupils: Pupils are equal, round, and reactive to light.  Neck:     Trachea: Trachea and phonation normal.  Cardiovascular:     Rate and Rhythm: Normal rate and regular rhythm.     Pulses:  Normal pulses.     Heart sounds: Normal heart sounds. No murmur heard. Pulmonary:     Effort: Pulmonary effort is normal. No tachypnea, bradypnea, accessory muscle usage or respiratory distress.     Breath sounds: Normal breath sounds. No wheezing or rales.  Chest:     Chest wall: No mass, lacerations, deformity, swelling, tenderness or crepitus.  Abdominal:     General: Bowel sounds are normal. There is no distension.     Palpations: Abdomen is soft.     Tenderness: There is abdominal tenderness in the right upper quadrant. There is no right CVA tenderness, left CVA tenderness, guarding or rebound. Negative signs include Murphy's sign, Rovsing's sign, McBurney's sign and psoas sign.  Musculoskeletal:        General: No deformity.     Cervical back: Normal range of motion and neck supple. No edema, rigidity or crepitus. No pain with movement, spinous process tenderness or muscular tenderness.     Right lower leg: No edema.     Left lower leg: No edema.  Lymphadenopathy:     Cervical: No cervical adenopathy.  Skin:    General: Skin is warm and dry.  Neurological:     Mental Status: She is alert. Mental status is at baseline.     Gait: Gait is intact.  Psychiatric:        Mood and Affect: Mood normal.    ED Results / Procedures / Treatments   Labs (all labs ordered are listed, but only abnormal results are displayed) Labs Reviewed  CBC WITH DIFFERENTIAL/PLATELET - Abnormal; Notable for the following components:      Result Value   HCT 35.7 (*)    All other components within normal limits  URINALYSIS, ROUTINE W REFLEX MICROSCOPIC - Abnormal; Notable for the following components:   APPearance CLOUDY (*)    Hgb urine dipstick TRACE (*)    Leukocytes,Ua MODERATE (*)    All other components within normal limits  URINALYSIS, MICROSCOPIC (REFLEX) - Abnormal; Notable for the following components:   Bacteria, UA RARE (*)    All other components within normal limits  COMPREHENSIVE  METABOLIC PANEL  LIPASE, BLOOD  PREGNANCY, URINE    EKG None  Radiology No results found.  Procedures Procedures   Medications Ordered in ED Medications - No data to display  ED Course  I have reviewed the triage vital signs and the nursing notes.  Pertinent labs & imaging results that were available during my care of the patient were reviewed by me and considered in my medical decision making (see chart for details).    MDM Rules/Calculators/A&P  25 year old female presents with concern for dysuria and right upper belly pain x1 week.  Differential diagnosis includes is not limited to cystitis, pyelonephritis, STI, cholecystitis, nephrolithiasis.  Vital signs are normal on intake.  Cardiac exam is normal, pulmonary exam is normal, abdominal exam with very mild right upper quadrant tenderness to deep palpation without guarding or rebound, no Murphy sign.  No suprapubic tenderness palpation and no CVA tenderness bilaterally.  Patient is neurovascular intact in all 4 extremities.  CBC and CMP are unremarkable, patient is not pregnant, and lipase is normal.  UA with trace hemoglobin, moderate leukocytes and rare bacteria, however given patient's symptomatology will proceed with treatment for dysuria likely urinary tract infection.  Patient without known drug allergies we will proceed with Keflex.  First dose of antibiotic administered in the ER.  No further work-up is warranted in ED at this time.  Sherrilynn voiced understanding for medical evaluation and treatment plan.  Each of her questions was answered to her expressed satisfaction.  Return precautions were given.  Patient is well-appearing, stable, and appropriate for discharge at this time.  This chart was dictated using voice recognition software, Dragon. Despite the best efforts of this provider to proofread and correct errors, errors may still occur which can change documentation meaning.   Final Clinical  Impression(s) / ED Diagnoses Final diagnoses:  None    Rx / DC Orders ED Discharge Orders     None        Sherrilee Gilles 11/21/20 1124    Cathren Laine, MD 11/21/20 1334

## 2020-11-21 NOTE — Discharge Instructions (Signed)
You were seen in the ER today for your urinary discomfort.  You have been diagnosed with a urinary tract infection.  Your blood work and physical exam are otherwise very reassuring.  You have been prescribed an antibiotic to take 4 times a day for the next 5 days.  Please take it as prescribed for the entire course.  Additionally been prescribed a short course of medication called Pyridium to help with your discomfort with urination for the next few days while the antibiotics begin to take effect.  Return to the ER with any new nausea or vomiting that does not stop, fevers, chills, or any other new severe symptoms.

## 2020-11-21 NOTE — ED Provider Notes (Signed)
Emergency Medicine Provider Triage Evaluation Note  Christina Gill , a 25 y.o. female  was evaluated in triage.  Pt complains of dysuria and right upper abdominal pain x1 week, not improved with over-the-counter UTI medications.  History of recurrent BV but no history of UTI.Marland Kitchen  Review of Systems  Positive: Dysuria, urinary frequency and urgency, abdominal pain Negative: Nausea, vomiting, fevers, chills, diarrhea  Physical Exam  BP 115/72 (BP Location: Left Arm)   Pulse (!) 58   Temp 98.4 F (36.9 C) (Oral)   Resp 16   Ht 5\' 7"  (1.702 m)   Wt 77.1 kg   LMP 11/09/2020 (Approximate)   SpO2 100%   BMI 26.63 kg/m  Gen:   Awake, no distress   Resp:  Normal effort  MSK:   Moves extremities without difficulty  Other:  Right upper quadrant tenderness to palpation, no CVA tenderness.  Medical Decision Making  Medically screening exam initiated at 9:31 AM.  Appropriate orders placed.  Christina Gill was informed that the remainder of the evaluation will be completed by another provider, this initial triage assessment does not replace that evaluation, and the importance of remaining in the ED until their evaluation is complete.  This chart was dictated using voice recognition software, Dragon. Despite the best efforts of this provider to proofread and correct errors, errors may still occur which can change documentation meaning.    11/11/2020, PA-C 11/21/20 0931    01/21/21, MD 11/21/20 1334

## 2021-06-17 ENCOUNTER — Emergency Department (HOSPITAL_COMMUNITY)
Admission: EM | Admit: 2021-06-17 | Discharge: 2021-06-17 | Disposition: A | Discharge status: Home / Self Care | Payer: Self-pay | Attending: Emergency Medicine | Admitting: Emergency Medicine

## 2021-06-17 ENCOUNTER — Other Ambulatory Visit: Payer: Self-pay

## 2021-06-17 ENCOUNTER — Encounter (HOSPITAL_COMMUNITY): Payer: Self-pay

## 2021-06-17 DIAGNOSIS — J069 Acute upper respiratory infection, unspecified: Secondary | ICD-10-CM | POA: Insufficient documentation

## 2021-06-17 DIAGNOSIS — R051 Acute cough: Secondary | ICD-10-CM

## 2021-06-17 DIAGNOSIS — J029 Acute pharyngitis, unspecified: Secondary | ICD-10-CM

## 2021-06-17 DIAGNOSIS — Z20822 Contact with and (suspected) exposure to covid-19: Secondary | ICD-10-CM | POA: Insufficient documentation

## 2021-06-17 LAB — RESP PANEL BY RT-PCR (FLU A&B, COVID) ARPGX2
Influenza A by PCR: NEGATIVE
Influenza B by PCR: NEGATIVE
SARS Coronavirus 2 by RT PCR: NEGATIVE

## 2021-06-17 LAB — GROUP A STREP BY PCR: Group A Strep by PCR: NOT DETECTED

## 2021-06-17 MED ORDER — CETIRIZINE HCL 10 MG PO TABS: 10.0000 mg | ORAL_TABLET | Freq: Every day | ORAL | 0 refills | Status: DC

## 2021-06-17 MED ORDER — FLUTICASONE PROPIONATE 50 MCG/ACT NA SUSP: 2.0000 | Freq: Every day | NASAL | 0 refills | Status: DC

## 2021-06-17 NOTE — ED Provider Notes (Signed)
?[facility] EMERGENCY DEPARTMENT ?Provider Note ? ? ?CSN: [id number] ?Arrival date & time: 06/17/21  1103 ? ?  ? ?History ? ?Chief Complaint  ?Patient presents with  ? Headache  ? Sore Throat  ? Cough  ? ? ?Christina Gill is a 26 y.o. female. ? ? ?Headache ?Associated symptoms: cough   ?Sore Throat ?Associated symptoms include headaches.  ?Cough ?Associated symptoms: headaches   ? ?Patient is a 26 year old female presented emergency room today with complaints of sore throat, headache, cough, myalgias ongoing for approximately 7 days.  Denies any known sick contacts denies any fever denies any abdominal pain denies any urinary frequency urgency dysuria hematuria.  Denies any chest pain or difficulty breathing.  No hemoptysis.  No other associate symptoms. ? ? ? ?  ? ?Home Medications ?Prior to Admission medications   ?Medication Sig Start Date End Date Taking? Authorizing Provider  ?cetirizine (ZYRTEC ALLERGY) 10 MG tablet Take 1 tablet (10 mg total) by mouth daily for 14 days. 06/17/21 07/01/21 Yes Renezmae Canlas, Rodrigo Ran, PA  ?fluticasone (FLONASE) 50 MCG/ACT nasal spray Place 2 sprays into both nostrils daily for 14 days. 06/17/21 07/01/21 Yes Gailen Shelter, PA  ?loperamide (IMODIUM) 2 MG capsule Take 1 capsule (2 mg total) by mouth 4 (four) times daily as needed for diarrhea or loose stools. 10/09/19   Wallis Bamberg, PA-C  ?ondansetron (ZOFRAN-ODT) 8 MG disintegrating tablet Take 1 tablet (8 mg total) by mouth every 8 (eight) hours as needed for nausea or vomiting. 10/09/19   Wallis Bamberg, PA-C  ?phenazopyridine (PYRIDIUM) 200 MG tablet Take 1 tablet (200 mg total) by mouth 3 (three) times daily. 11/21/20   Sponseller, Eugene Gavia, PA-C  ?glipiZIDE (GLUCOTROL) 10 MG tablet Take 1 tablet (10 mg total) by mouth daily before breakfast. 10/09/19 10/09/19  Mardella Layman, MD  ?   ? ?Allergies    ?Patient has no known allergies.   ? ?Review of Systems   ?Review of Systems  ?Respiratory:  Positive for cough.    ?Neurological:  Positive for headaches.  ? ?Physical Exam ?Updated Vital Signs ?BP 120/82 (BP Location: Right Arm)   Pulse 61   Temp 98.2 ?F (36.8 ?C) (Oral)   Resp 18   Ht 5\' 7"  (1.702 m)   Wt 90.7 kg   SpO2 100%   BMI 31.32 kg/m?  ?Physical Exam ?Vitals and nursing note reviewed.  ?Constitutional:   ?   General: She is not in acute distress. ?HENT:  ?   Head: Normocephalic and atraumatic.  ?   Nose: Nose normal.  ?   Mouth/Throat:  ?   Mouth: Mucous membranes are moist.  ?   Comments: Posterior pharynx with mild erythema and cobblestoning.  Some clear PND.  Uvula midline.  Normal phonation. ?Eyes:  ?   General: No scleral icterus. ?Cardiovascular:  ?   Rate and Rhythm: Normal rate and regular rhythm.  ?   Pulses: Normal pulses.  ?   Heart sounds: Normal heart sounds.  ?Pulmonary:  ?   Effort: Pulmonary effort is normal. No respiratory distress.  ?   Breath sounds: No wheezing.  ?Abdominal:  ?   Palpations: Abdomen is soft.  ?   Tenderness: There is no abdominal tenderness.  ?Musculoskeletal:  ?   Cervical back: Normal range of motion.  ?   Right lower leg: No edema.  ?   Left lower leg: No edema.  ?Skin: ?   General: Skin is warm and dry.  ?  Capillary Refill: Capillary refill takes less than 2 seconds.  ?Neurological:  ?   Mental Status: She is alert. Mental status is at baseline.  ?Psychiatric:     ?   Mood and Affect: Mood normal.     ?   Behavior: Behavior normal.  ? ? ?ED Results / Procedures / Treatments   ?Labs ?(all labs ordered are listed, but only abnormal results are displayed) ?Labs Reviewed  ?GROUP A STREP BY PCR  ?RESP PANEL BY RT-PCR (FLU A&B, COVID) [id number]  ? ? ?EKG ?None ? ?Radiology ?No results found. ? ?Procedures ?Procedures  ? ? ?Medications Ordered in ED ?Medications - No data to display ? ?ED Course/ Medical Decision Making/ A&P ?  ?                        ?Medical Decision Making ?Risk ?OTC drugs. ? ? ? ?Patient is a 26 year old female presented emergency room today with complaints  of sore throat, headache, cough, myalgias ongoing for approximately 7 days.  Denies any known sick contacts denies any fever denies any abdominal pain denies any urinary frequency urgency dysuria hematuria.  Denies any chest pain or difficulty breathing.  No hemoptysis.  No other associate symptoms. ? ? ?Neg strep/covid/flu ? ?Patient is overall quite well-appearing I doubt sinister disease such as PE, myocarditis, other.  Strongly suspect that this is a viral illness and given that patient had a very reassuring exam and is tolerating p.o., ambulatory, breathing well and seems to have minimal symptoms will treat empirically with fluticasone and Zyrtec to help with her clear PND which is likely causing her sore throat and cough.  Return precautions given.  Nettie pot recommended ? ?Patient agreeable to plan.,  With vital signs within normal limits at this time ? ?Final Clinical Impression(s) / ED Diagnoses ?Final diagnoses:  ?Acute cough  ?Viral URI with cough  ?Sore throat  ? ? ?Rx / DC Orders ?ED Discharge Orders   ? ?      Ordered  ?  cetirizine (ZYRTEC ALLERGY) 10 MG tablet  Daily       ? 06/17/21 1450  ?  fluticasone (FLONASE) 50 MCG/ACT nasal spray  Daily       ? 06/17/21 1450  ? ?  ?  ? ?  ? ? ?  ?Gailen Shelter, [place] ?06/18/21 0745 ? ?  ?Terald Sleeper, MD ?06/18/21 2028 ? ?

## 2021-06-17 NOTE — ED Triage Notes (Signed)
Pt arrived POV from home c/o sore throat, headache and a cough x1 week. Pt denies any sick contacts or fevers. Pt has tried allergy medicine at home and it did not help.  ?

## 2021-06-17 NOTE — Discharge Instructions (Signed)
Use Flonase and Zyrtec as prescribed. ? ?Warm tea and honey ? ?Hydrate ?

## 2021-07-09 ENCOUNTER — Other Ambulatory Visit: Payer: Self-pay

## 2021-07-09 ENCOUNTER — Emergency Department (HOSPITAL_COMMUNITY)
Admission: EM | Admit: 2021-07-09 | Discharge: 2021-07-09 | Disposition: A | Discharge status: Home / Self Care | Payer: Self-pay | Attending: Emergency Medicine | Admitting: Emergency Medicine

## 2021-07-09 DIAGNOSIS — R202 Paresthesia of skin: Secondary | ICD-10-CM

## 2021-07-09 LAB — BASIC METABOLIC PANEL
Anion gap: 4 — ABNORMAL LOW (ref 5–15)
BUN: 8 mg/dL (ref 6–20)
CO2: 27 mmol/L (ref 22–32)
Calcium: 8.8 mg/dL — ABNORMAL LOW (ref 8.9–10.3)
Chloride: 106 mmol/L (ref 98–111)
Creatinine, Ser: 0.82 mg/dL (ref 0.44–1.00)
GFR, Estimated: >60 mL/min (ref >60)
Glucose, Bld: 89 mg/dL (ref 70–99)
Potassium: 4.2 mmol/L (ref 3.5–5.1)
Sodium: 137 mmol/L (ref 135–145)

## 2021-07-09 LAB — CBC WITH DIFFERENTIAL/PLATELET
Abs Immature Granulocytes: 0.01 10*3/uL (ref 0.00–0.07)
Basophils Absolute: 0 10*3/uL (ref 0.0–0.1)
Basophils Relative: 1 %
Eosinophils Absolute: 0.2 10*3/uL (ref 0.0–0.5)
Eosinophils Relative: 6 %
HCT: 35.4 % — ABNORMAL LOW (ref 36.0–46.0)
Hemoglobin: 11.7 g/dL — ABNORMAL LOW (ref 12.0–15.0)
Immature Granulocytes: 0 %
Lymphocytes Relative: 41 %
Lymphs Abs: 1.6 10*3/uL (ref 0.7–4.0)
MCH: 30.1 pg (ref 26.0–34.0)
MCHC: 33.1 g/dL (ref 30.0–36.0)
MCV: 91 fL (ref 80.0–100.0)
Monocytes Absolute: 0.3 10*3/uL (ref 0.1–1.0)
Monocytes Relative: 6 %
Neutro Abs: 1.9 10*3/uL (ref 1.7–7.7)
Neutrophils Relative %: 46 %
Platelets: 195 10*3/uL (ref 150–400)
RBC: 3.89 MIL/uL (ref 3.87–5.11)
RDW: 12.1 % (ref 11.5–15.5)
WBC: 4 10*3/uL (ref 4.0–10.5)
nRBC: 0 % (ref 0.0–0.2)

## 2021-07-09 LAB — URINALYSIS, ROUTINE W REFLEX MICROSCOPIC
Bilirubin Urine: NEGATIVE
Glucose, UA: NEGATIVE mg/dL
Hgb urine dipstick: NEGATIVE
Ketones, ur: NEGATIVE mg/dL
Leukocytes,Ua: NEGATIVE
Nitrite: NEGATIVE
Protein, ur: NEGATIVE mg/dL
Specific Gravity, Urine: 1.013 (ref 1.005–1.030)
pH: 8 (ref 5.0–8.0)

## 2021-07-09 LAB — PREGNANCY, URINE: Preg Test, Ur: NEGATIVE

## 2021-07-09 MED ORDER — GABAPENTIN 100 MG PO CAPS: 100.0000 mg | ORAL_CAPSULE | Freq: Three times a day (TID) | ORAL | 0 refills | Status: AC

## 2021-07-09 NOTE — ED Provider Notes (Signed)
?[facility] ?Provider Note ? ? ?CSN: [id number] ?Arrival date & time: 07/09/21  [id number] ? ?  ? ?History ? ?Chief Complaint  ?Patient presents with  ? Tingling  ?  Whole body   ? ? ?Christina Gill is a 26 y.o. female.  Patient has a chief complaint of tingling.  Patient states that her skin feels like it is tingling when touched.  Compares it to the feeling of "pins-and-needles".  Patient states this began approximately 2 days ago.  No known event to initiate it.  Patient denies any medical problems and takes no medications.  Denies headache, denies shortness of breath, denies abdominal pain, denies chest pain. ? ?HPI ? ?  ? ?Home Medications ?Prior to Admission medications   ?Medication Sig Start Date End Date Taking? Authorizing Provider  ?gabapentin (NEURONTIN) 100 MG capsule Take 1 capsule (100 mg total) by mouth 3 (three) times daily for 7 days. 07/09/21 07/16/21 Yes Dorothyann Peng, PA-C  ?Multiple Vitamins-Minerals (ONE-A-DAY WOMENS PO) Take 2 tablets by mouth See admin instructions. Take 2 tablets by mouth when able to remember.   Yes [provider]  ?cetirizine (ZYRTEC ALLERGY) 10 MG tablet Take 1 tablet (10 mg total) by mouth daily for 14 days. ?Patient not taking: Reported on 07/09/2021 06/17/21 07/01/21  Tedd Sias, PA  ?fluticasone (FLONASE) 50 MCG/ACT nasal spray Place 2 sprays into both nostrils daily for 14 days. ?Patient not taking: Reported on 07/09/2021 06/17/21 07/01/21  Tedd Sias, PA  ?phenazopyridine (PYRIDIUM) 200 MG tablet Take 1 tablet (200 mg total) by mouth 3 (three) times daily. ?Patient not taking: Reported on 07/09/2021 11/21/20   Sponseller, Gypsy Balsam, PA-C  ?glipiZIDE (GLUCOTROL) 10 MG tablet Take 1 tablet (10 mg total) by mouth daily before breakfast. 10/09/19 10/09/19  Vanessa Kick, MD  ?   ? ?Allergies    ?Patient has no known allergies.   ? ?Review of Systems   ?Review of Systems  ?Constitutional:  Negative for fever.   ?Respiratory:  Negative for shortness of breath.   ?Cardiovascular:  Negative for chest pain.  ?Gastrointestinal:  Negative for abdominal pain and nausea.  ?Neurological:  Positive for numbness (Described as tingling). Negative for headaches.  ? ?Physical Exam ?Updated Vital Signs ?BP 115/69   Pulse 64   Temp 98.4 ?F (36.9 ?C) (Oral)   Resp 16   SpO2 99%  ?Physical Exam ?Vitals and nursing note reviewed.  ?HENT:  ?   Head: Normocephalic and atraumatic.  ?Eyes:  ?   Extraocular Movements: Extraocular movements intact.  ?   Conjunctiva/sclera: Conjunctivae normal.  ?   Pupils: Pupils are equal, round, and reactive to light.  ?Cardiovascular:  ?   Rate and Rhythm: Normal rate and regular rhythm.  ?   Pulses: Normal pulses.  ?   Heart sounds: Normal heart sounds.  ?Pulmonary:  ?   Effort: Pulmonary effort is normal.  ?   Breath sounds: Normal breath sounds.  ?Abdominal:  ?   Palpations: Abdomen is soft.  ?   Tenderness: There is no abdominal tenderness.  ?Musculoskeletal:     ?   General: Normal range of motion.  ?   Cervical back: Normal range of motion and neck supple.  ?Skin: ?   General: Skin is warm and dry.  ?   Capillary Refill: Capillary refill takes less than 2 seconds.  ?Neurological:  ?   Mental Status: She is oriented to person, place, and time.  ?  GCS: GCS eye subscore is 4. GCS verbal subscore is 5. GCS motor subscore is 6.  ?   Motor: Motor function is intact. No weakness.  ?   Coordination: Coordination is intact. Coordination normal. Heel to [facility] Test normal.  ?   Deep Tendon Reflexes: Reflexes are normal and symmetric.  ?   Comments: Cranial nerves II through VII, XI, XII intact ? ?Patient has equal sensation bilaterally from head to toe, but states that she feels tingling when touched  ? ? ?ED Results / Procedures / Treatments   ?Labs ?(all labs ordered are listed, but only abnormal results are displayed) ?Labs Reviewed  ?BASIC METABOLIC PANEL - Abnormal; Notable for the following components:   ?    Result Value  ? Calcium 8.8 (*)   ? Anion gap 4 (*)   ? All other components within normal limits  ?CBC WITH DIFFERENTIAL/PLATELET - Abnormal; Notable for the following components:  ? Hemoglobin 11.7 (*)   ? HCT 35.4 (*)   ? All other components within normal limits  ?URINALYSIS, ROUTINE W REFLEX MICROSCOPIC  ?PREGNANCY, URINE  ? ? ?EKG ?None ? ?Radiology ?No results found. ? ?Procedures ?Procedures  ? ? ?Medications Ordered in ED ?Medications - No data to display ? ?ED Course/ Medical Decision Making/ A&P ?  ?                        ?Medical Decision Making ?Amount and/or Complexity of Data Reviewed ?Labs: ordered. ? ? ?Patient presents with tingling bilaterally.  Differential includes neuropathy, somatic patient, cervical spine issues ? ?The patient has the tingling uniformly from face to feet.  This makes cervical spine involvement highly unlikely.  Neuro exam normal except for patient's paresthesias.  No concern of CVA. ? ?Discussed case with neurology who suggested the patient try short course of gabapentin and follow-up with primary care.  Discussed plan with who voiced understanding and agreement.  Plan to discharge home with gabapentin.  Patient has primary care in [facility] and plans to be there over the next week.  Recommended follow-up at that time. ? ?Final Clinical Impression(s) / ED Diagnoses ?Final diagnoses:  ?Tingling  ?Paresthesia  ? ? ?Rx / DC Orders ?ED Discharge Orders   ? ?      Ordered  ?  gabapentin (NEURONTIN) 100 MG capsule  3 times daily       ? 07/09/21 1217  ? ?  ?  ? ?  ? ? ?  ?Dorothyann Peng, PA-C ?07/09/21 1218 ? ?  ?Davonna Belling, MD ?07/09/21 1618 ? ?

## 2021-07-09 NOTE — ED Triage Notes (Signed)
Pt reports whole body tingling x 3-4 days. Pt reports this has happened previously but has gone away on it's own. Pt endorses HA but denies dizziness/ numbness/blurry vision. ? ? ? ?

## 2021-07-09 NOTE — ED Notes (Signed)
Pt verbalizes understanding of discharge instructions. Opportunity for questions and answers were provided. Pt discharged from the ED.   ?

## 2021-07-09 NOTE — Discharge Instructions (Addendum)
You were seen today for tingling. Your workup shows no concern for stroke. Lab values were normal. Your neurologic exam was normal except for the tingling. I have prescribed a medication which is used to treat nerve pain. Recommend follow up with your primary care as soon as possible for further evaluation.  ?

## 2021-10-08 ENCOUNTER — Encounter (HOSPITAL_BASED_OUTPATIENT_CLINIC_OR_DEPARTMENT_OTHER): Payer: Self-pay | Admitting: Emergency Medicine

## 2021-10-08 ENCOUNTER — Emergency Department (HOSPITAL_BASED_OUTPATIENT_CLINIC_OR_DEPARTMENT_OTHER)
Admission: EM | Admit: 2021-10-08 | Discharge: 2021-10-08 | Disposition: A | Discharge status: Home / Self Care | Payer: Self-pay | Attending: Emergency Medicine | Admitting: Emergency Medicine

## 2021-10-08 ENCOUNTER — Other Ambulatory Visit: Payer: Self-pay

## 2021-10-08 DIAGNOSIS — H029 Unspecified disorder of eyelid: Secondary | ICD-10-CM

## 2021-10-08 DIAGNOSIS — H02821 Cysts of right upper eyelid: Secondary | ICD-10-CM | POA: Insufficient documentation

## 2021-10-08 MED ORDER — DOXYCYCLINE HYCLATE 100 MG PO CAPS: 100.0000 mg | ORAL_CAPSULE | Freq: Two times a day (BID) | ORAL | 0 refills | Status: DC

## 2021-10-08 MED ORDER — DOXYCYCLINE HYCLATE 100 MG PO TABS
100.0000 mg | ORAL_TABLET | Freq: Once | ORAL | Status: AC
  Administered 2021-10-08: 100 mg via ORAL
  Filled 2021-10-08: qty 1

## 2021-10-08 MED ORDER — POLYMYXIN B-TRIMETHOPRIM 10000-0.1 UNIT/ML-% OP SOLN: 1.0000 [drp] | OPHTHALMIC | 0 refills | Status: AC

## 2021-10-08 NOTE — ED Triage Notes (Signed)
Pt  here from home with c/o a bump above her right eye , pt tried to squeeze it last night and now it hurts this morning m bump has been there for over a year

## 2021-10-08 NOTE — ED Provider Notes (Signed)
[facility] EMERGENCY DEPT Provider Note   CSN: [id number] Arrival date & time: 10/08/21  0907     History  Chief Complaint  Patient presents with   Eye Problem    Christina Gill is a 26 y.o. female.  Patient presents with lesion to her right upper eyelid.  States the bump has been present for the past 1.5 years.  It became painful within the past day patient tried to squeeze it last night with her fingers.  Was not able to get anything out of it.  Normally does not cause her pain.  Has had increased pain and redness this morning.  No bleeding or drainage.  No fever.  No visual changes.  No nausea or vomiting.  No chest pain or shortness of breath.  No one has evaluated the mom before today.  The history is provided by the patient.  Eye Problem      Home Medications Prior to Admission medications   Medication Sig Start Date End Date Taking? Authorizing Provider  cetirizine (ZYRTEC ALLERGY) 10 MG tablet Take 1 tablet (10 mg total) by mouth daily for 14 days. Patient not taking: Reported on 07/09/2021 06/17/21 07/01/21  Gailen Shelter, PA  fluticasone (FLONASE) 50 MCG/ACT nasal spray Place 2 sprays into both nostrils daily for 14 days. Patient not taking: Reported on 07/09/2021 06/17/21 07/01/21  Gailen Shelter, PA  gabapentin (NEURONTIN) 100 MG capsule Take 1 capsule (100 mg total) by mouth 3 (three) times daily for 7 days. 07/09/21 07/16/21  Darrick Grinder, PA-C  Multiple Vitamins-Minerals (ONE-A-DAY WOMENS PO) Take 2 tablets by mouth See admin instructions. Take 2 tablets by mouth when able to remember.    [provider]  phenazopyridine (PYRIDIUM) 200 MG tablet Take 1 tablet (200 mg total) by mouth 3 (three) times daily. Patient not taking: Reported on 07/09/2021 11/21/20   Sponseller, Lupe Carney R, PA-C  glipiZIDE (GLUCOTROL) 10 MG tablet Take 1 tablet (10 mg total) by mouth daily before breakfast. 10/09/19 10/09/19  Mardella Layman, MD      Allergies     Patient has no known allergies.    Review of Systems   Review of Systems  Physical Exam Updated Vital Signs BP 121/83   Pulse 71   Temp 97.7 F (36.5 C) (Oral)   Resp 18   Ht 5\' 7"  (1.702 m)   Wt 81.6 kg   SpO2 98%   BMI 28.19 kg/m  Physical Exam Vitals and nursing note reviewed.  Constitutional:      General: She is not in acute distress.    Appearance: She is well-developed.  HENT:     Head: Normocephalic and atraumatic.     Mouth/Throat:     Pharynx: No oropharyngeal exudate.  Eyes:     Conjunctiva/sclera: Conjunctivae normal.     Pupils: Pupils are equal, round, and reactive to light.     Comments: There is a 0.5 cm area of induration to the left upper lid near medial canthus.  Tender to palpation.  No fluctuance.  Extraocular movements are intact.  Neck:     Comments: No meningismus. Cardiovascular:     Rate and Rhythm: Normal rate and regular rhythm.     Heart sounds: Normal heart sounds. No murmur heard. Pulmonary:     Effort: Pulmonary effort is normal. No respiratory distress.     Breath sounds: Normal breath sounds.  Abdominal:     Palpations: Abdomen is soft.     Tenderness: There is  no abdominal tenderness. There is no guarding or rebound.  Musculoskeletal:        General: No tenderness. Normal range of motion.     Cervical back: Normal range of motion and neck supple.  Skin:    General: Skin is warm.  Neurological:     Mental Status: She is alert and oriented to person, place, and time.     Cranial Nerves: No cranial nerve deficit.     Motor: No abnormal muscle tone.     Coordination: Coordination normal.     Comments:  5/5 strength throughout. CN 2-12 intact.Equal grip strength.   Psychiatric:        Behavior: Behavior normal.     ED Results / Procedures / Treatments   Labs (all labs ordered are listed, but only abnormal results are displayed) Labs Reviewed - No data to display  EKG None  Radiology No results  found.  Procedures Procedures    Medications Ordered in ED Medications - No data to display  ED Course/ Medical Decision Making/ A&P                           Medical Decision Making Amount and/or Complexity of Data Reviewed Labs: ordered. Decision-making details documented in ED Course. Radiology: ordered and independent interpretation performed. Decision-making details documented in ED Course. ECG/medicine tests: ordered and independent interpretation performed. Decision-making details documented in ED Course.  Risk Prescription drug management.  Lesion to upper eyelid.  Not painful until she tried to squeeze it yesterday.  No fever.  No visual changes.  Appears to have some kind of cyst/chalazion to the left upper lid with possible overlying early infection.  No fluctuance.  Discussed possibility of aspiration with the patient to assess for fluctuance.  She is hesitant given the location near her eyeball.  No acute visual changes or evidence of sepsis.  Needs follow-up with ophthalmology.  Discussed with Dr. Allena Katz who will see patient in the office in the next day or 2 and asked her to call for an appointment.  Agrees with antibiotics and warm compresses.  Patient declines aspiration or drainage in the ED  Dr. Allena Katz will see patient later today or tomorrow.  We will give topical and p.o. antibiotics as well as warm compresses. Return to the ED with visual changes, difficulty moving the eye, fever, or other concerns.        Final Clinical Impression(s) / ED Diagnoses Final diagnoses:  Eyelid lesion    Rx / DC Orders ED Discharge Orders     None         Krisann Mckenna, Jeannett Senior, MD 10/08/21 [phone]

## 2021-10-08 NOTE — Discharge Instructions (Signed)
Use warm compresses, take the antibiotics as prescribed and follow-up with the eye doctor today.  You declined incision in the emergency department today. Return to the ED with visual changes, fever, difficulty moving the eyes or any other concerns

## 2021-12-20 IMAGING — CT CT CERVICAL SPINE W/O CM
3 of 4 series · 12 of 33 positions shown, 14 images · non-contrast
Comparison: None.

CLINICAL DATA: Altercation.  Facial trauma

EXAM:
CT HEAD WITHOUT CONTRAST
CT MAXILLOFACIAL WITHOUT CONTRAST
CT CERVICAL SPINE WITHOUT CONTRAST
TECHNIQUE: Multidetector CT imaging of the head, cervical spine, and
maxillofacial structures were performed using the standard protocol
without intravenous contrast. Multiplanar CT image reconstructions
of the cervical spine and maxillofacial structures were also
generated.

[Series 5: c_spine 2.0 3 st · axial · 0.29mm/px · z∈[-270,-140]mm · 4 of 99 slices shown, 5 images]
[im 17/99  soft-tissue]
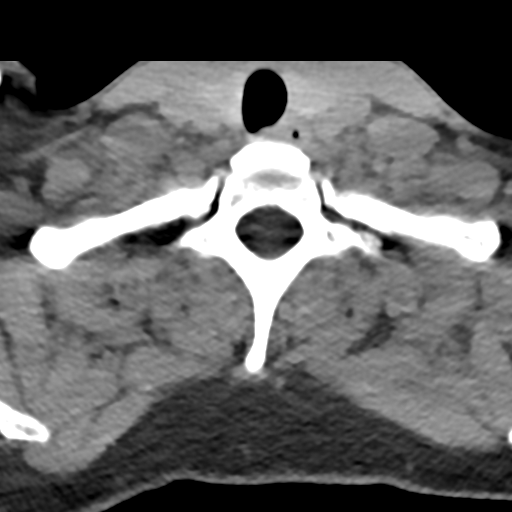
[im 17/99  bone]
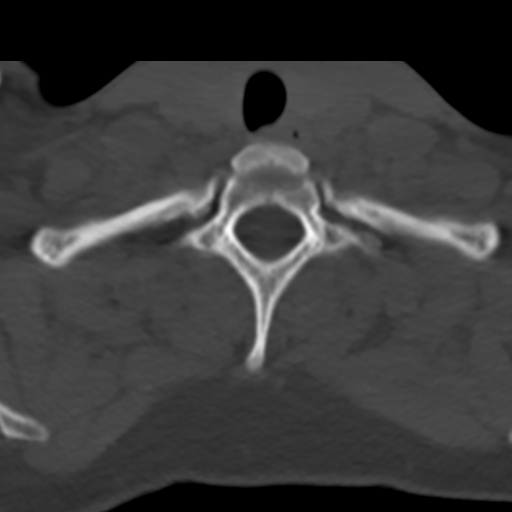
[im 33/99  bone]
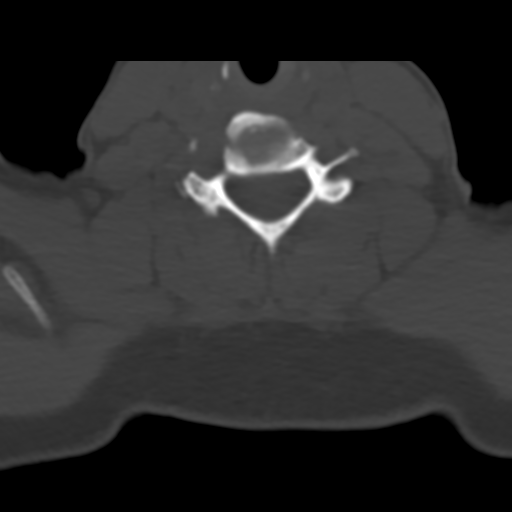
[im 66/99  bone]
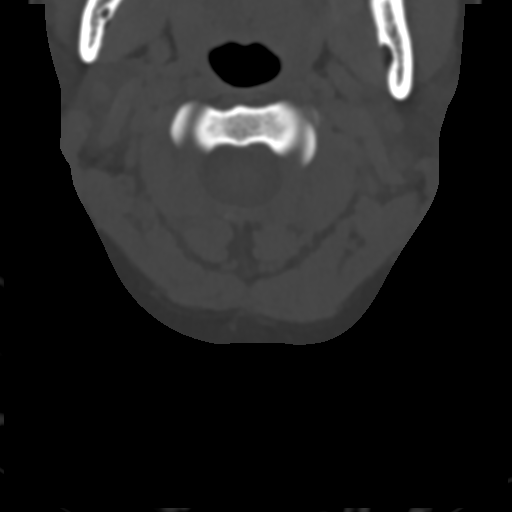
[im 82/99  bone]
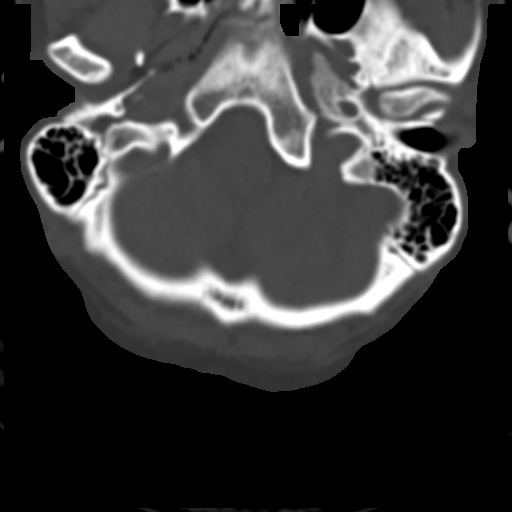

[Series 8: coronal bone · coronal · 0.29mm/px · 3 of 61 slices shown]
[im 13/61  bone]
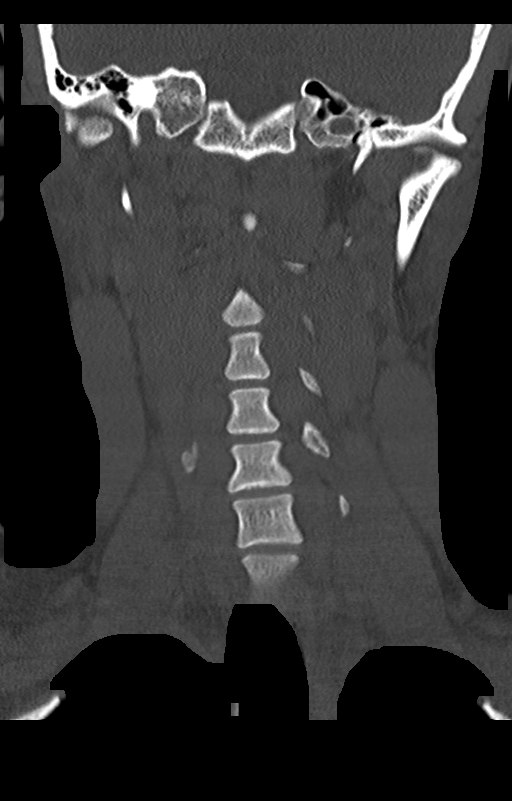
[im 25/61  bone]
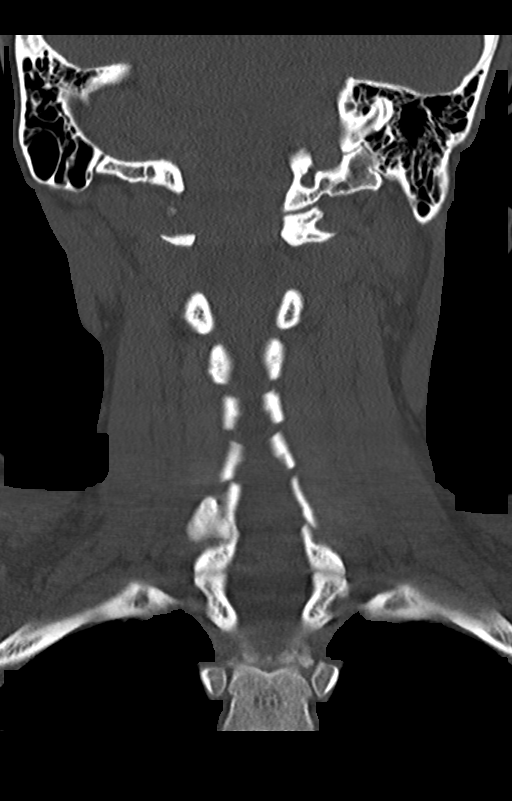
[im 37/61  bone]
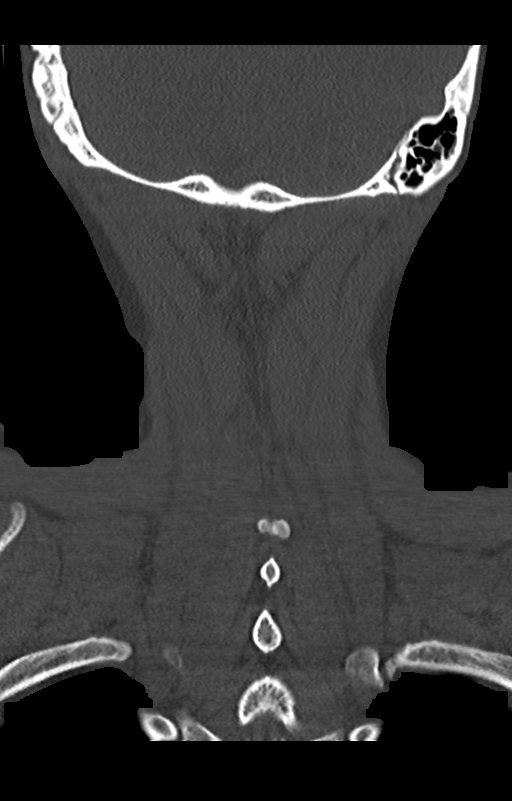

[Series 9: sagittal bone · sagittal · 0.29mm/px · 5 of 61 slices shown, 6 images]
[im 21/61  bone]
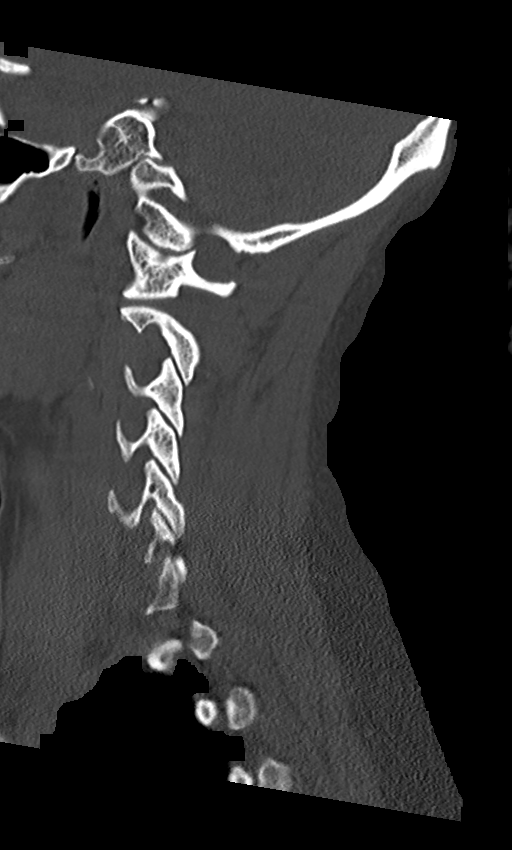
[im 26/61  bone]
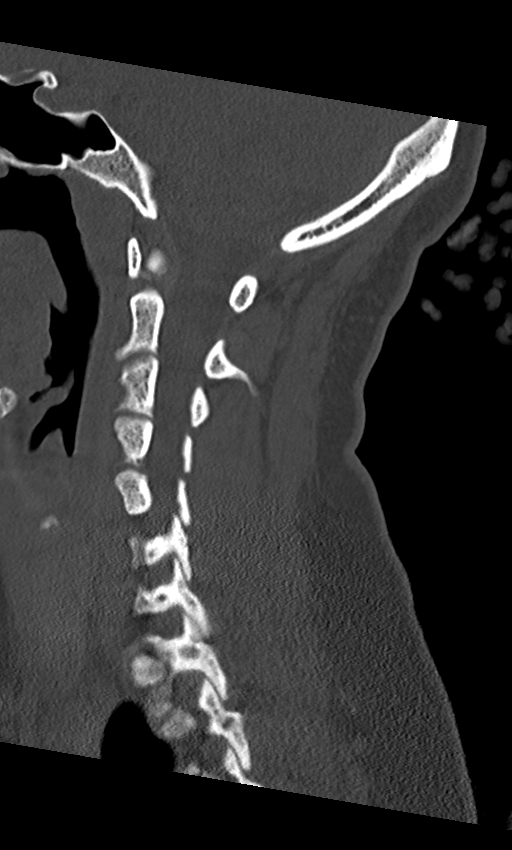
[im 31/61  soft-tissue]
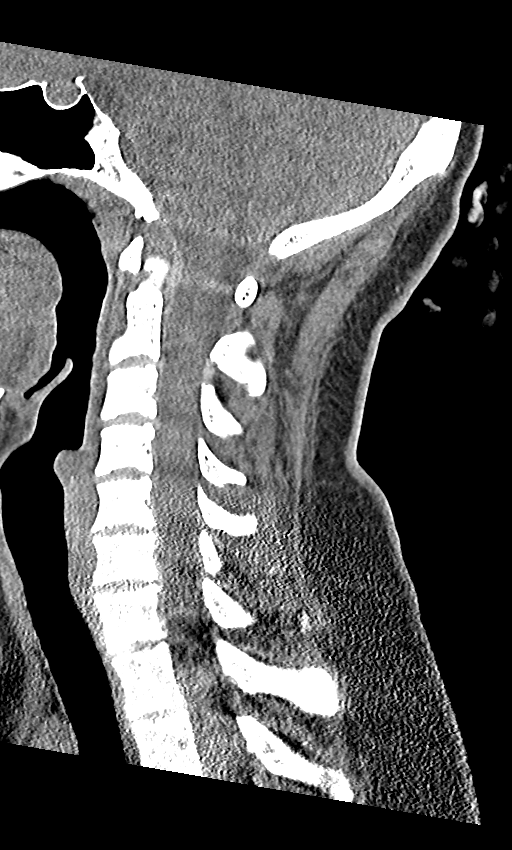
[im 31/61  bone]
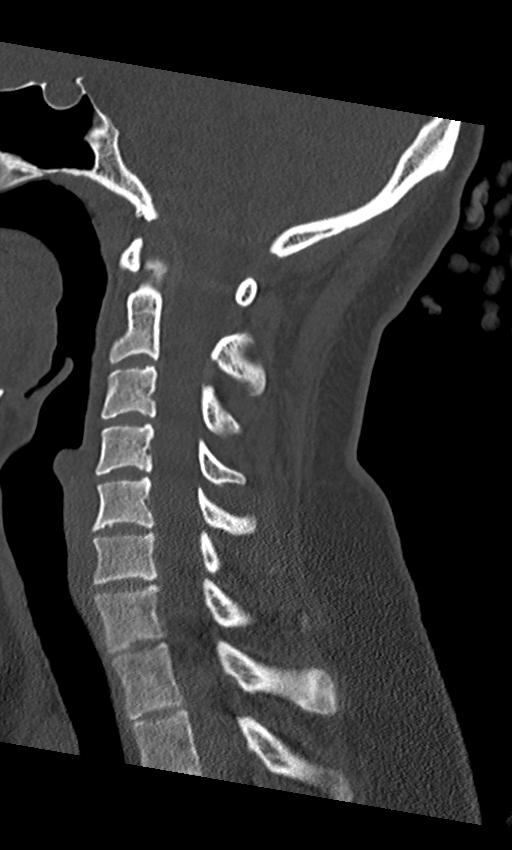
[im 36/61  bone]
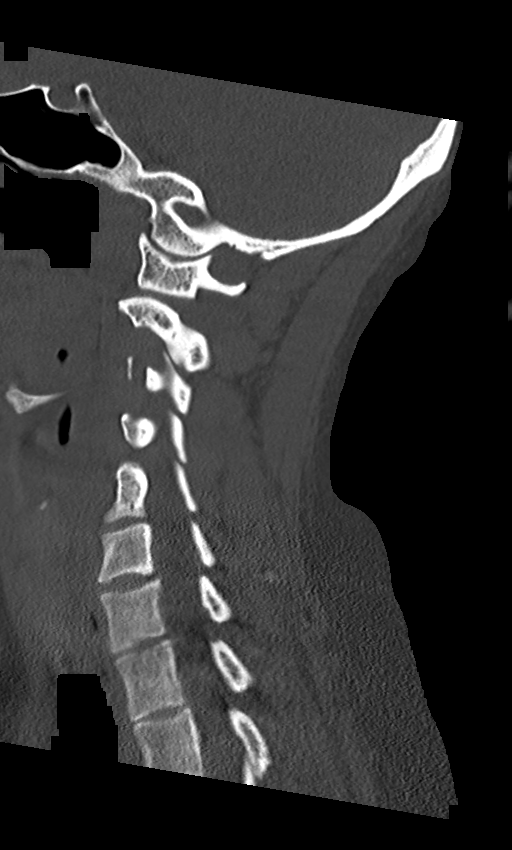
[im 41/61  bone]
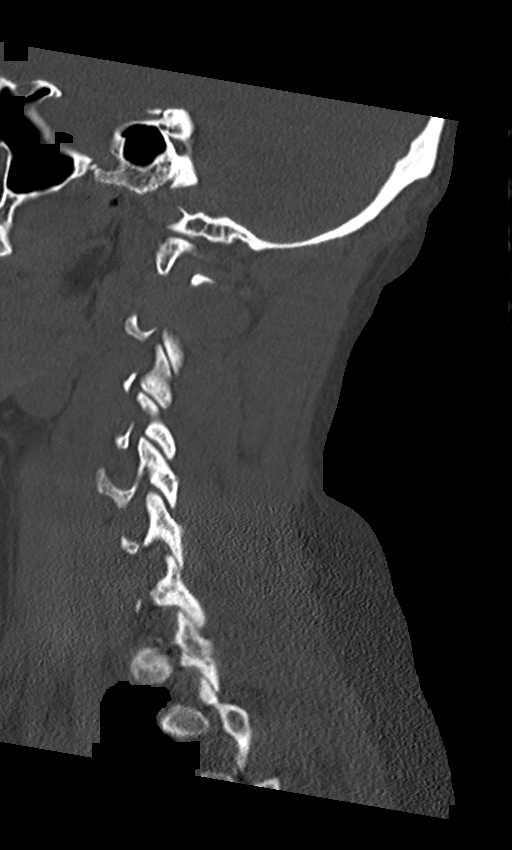

[12 of 33 positions shown; findings below may reference images not displayed]

FINDINGS: CT HEAD FINDINGS

Brain: No acute intracranial hemorrhage. No focal mass lesion. No CT
evidence of acute infarction. No midline shift or mass effect. No
hydrocephalus. Basilar cisterns are patent.

Vascular: No hyperdense vessel or unexpected calcification.

Skull: Normal. Negative for fracture or focal lesion.

Sinuses/Orbits: Paranasal sinuses and mastoid air cells are clear.
Orbits are clear.

Other: None.

CT MAXILLOFACIAL FINDINGS

Osseous: No fracture or mandibular dislocation. No destructive
process.

Orbits: Negative. No traumatic or inflammatory finding. No
proptosis. Globes normal

Sinuses: Clear.

Soft tissues: Negative.

CT CERVICAL SPINE FINDINGS

Alignment: Normal alignment of the cervical vertebral bodies.

Skull base and vertebrae: Normal craniocervical junction. No loss of
vertebral body height or disc height. Normal facet articulation. No
evidence of fracture.

Soft tissues and spinal canal: No prevertebral soft tissue swelling.
No perispinal or epidural hematoma.

Disc levels:  Unremarkable

Upper chest: Clear

Other: None
IMPRESSION: 1. No intracranial trauma.
2. No facial bone fracture.
3. No cervical spine fracture.

## 2022-02-19 ENCOUNTER — Other Ambulatory Visit: Payer: Self-pay

## 2022-02-19 ENCOUNTER — Emergency Department (HOSPITAL_BASED_OUTPATIENT_CLINIC_OR_DEPARTMENT_OTHER)
Admission: EM | Admit: 2022-02-19 | Discharge: 2022-02-19 | Disposition: A | Discharge status: Home / Self Care | Payer: BC Managed Care – PPO | Attending: Emergency Medicine | Admitting: Emergency Medicine

## 2022-02-19 ENCOUNTER — Emergency Department (HOSPITAL_BASED_OUTPATIENT_CLINIC_OR_DEPARTMENT_OTHER): Payer: BC Managed Care – PPO | Admitting: Radiology

## 2022-02-19 DIAGNOSIS — Z7984 Long term (current) use of oral hypoglycemic drugs: Secondary | ICD-10-CM | POA: Insufficient documentation

## 2022-02-19 DIAGNOSIS — M545 Low back pain, unspecified: Secondary | ICD-10-CM | POA: Diagnosis present

## 2022-02-19 DIAGNOSIS — Z79899 Other long term (current) drug therapy: Secondary | ICD-10-CM | POA: Insufficient documentation

## 2022-02-19 DIAGNOSIS — W1839XA Other fall on same level, initial encounter: Secondary | ICD-10-CM | POA: Insufficient documentation

## 2022-02-19 DIAGNOSIS — W19XXXA Unspecified fall, initial encounter: Secondary | ICD-10-CM

## 2022-02-19 LAB — PREGNANCY, URINE: Preg Test, Ur: NEGATIVE

## 2022-02-19 MED ORDER — CYCLOBENZAPRINE HCL 10 MG PO TABS
10.0000 mg | ORAL_TABLET | Freq: Once | ORAL | Status: AC
  Administered 2022-02-19: 10 mg via ORAL
  Filled 2022-02-19: qty 1

## 2022-02-19 MED ORDER — CYCLOBENZAPRINE HCL 10 MG PO TABS: 10.0000 mg | ORAL_TABLET | Freq: Three times a day (TID) | ORAL | 0 refills | Status: AC | PRN

## 2022-02-19 MED ORDER — HYDROCODONE-ACETAMINOPHEN 5-325 MG PO TABS: 1.0000 | ORAL_TABLET | Freq: Four times a day (QID) | ORAL | 0 refills | Status: AC | PRN

## 2022-02-19 MED ORDER — NAPROXEN 250 MG PO TABS
500.0000 mg | ORAL_TABLET | Freq: Once | ORAL | Status: AC
  Administered 2022-02-19: 500 mg via ORAL
  Filled 2022-02-19: qty 2

## 2022-02-19 MED ORDER — NAPROXEN 375 MG PO TABS: ORAL_TABLET | ORAL | 0 refills | Status: AC

## 2022-02-19 MED ORDER — HYDROCODONE-ACETAMINOPHEN 5-325 MG PO TABS
1.0000 | ORAL_TABLET | Freq: Once | ORAL | Status: AC
  Administered 2022-02-19: 1 via ORAL
  Filled 2022-02-19: qty 1

## 2022-02-19 NOTE — ED Triage Notes (Signed)
POV, sts she was playing around with girlfriend and fell back on carpet, c/o lower back pain that does not radiate. Ambulatory, alert and oriented x 4.

## 2022-02-19 NOTE — ED Provider Notes (Signed)
[facility] EMERGENCY Provider Note: Christina Dell, MD, FACEP  CSN: [id number] MRN: [id number] ARRIVAL: 02/19/22 at 0022 ROOM: DB009/DB009   CHIEF COMPLAINT  Fall   HISTORY OF PRESENT ILLNESS  02/19/22 2:51 AM Christina Gill is a 26 y.o. female who was horsing around with a friend about 4 PM yesterday afternoon and fell backwards onto her lower back.  She is now having mid lumbar pain which she rates as an 8 out of 10, worse with movement or palpation.  It is stabbing in nature.  It does not radiate to her buttocks or legs.  She is having no numbness or weakness.  She is having no bowel or bladder changes.   Past Medical History:  Diagnosis Date   Chlamydia     No past surgical history on file.  No family history on file.  Social History   Tobacco Use   Smoking status: Never   Smokeless tobacco: Never  Substance Use Topics   Alcohol use: Yes    Comment: occasional   Drug use: Yes    Frequency: 7.0 times per week    Types: Marijuana    Comment: smokes daily    Prior to Admission medications   Medication Sig Start Date End Date Taking? Authorizing Provider  cyclobenzaprine (FLEXERIL) 10 MG tablet Take 1 tablet (10 mg total) by mouth 3 (three) times daily as needed for muscle spasms. 02/19/22  Yes Hillman Attig, MD  HYDROcodone-acetaminophen (NORCO) 5-325 MG tablet Take 1 tablet by mouth every 6 (six) hours as needed for severe pain. 02/19/22  Yes Donnie Gedeon, MD  naproxen (NAPROSYN) 375 MG tablet Take 1 tablet twice daily as needed for back pain. 02/19/22  Yes Maciah Feeback, MD  gabapentin (NEURONTIN) 100 MG capsule Take 1 capsule (100 mg total) by mouth 3 (three) times daily for 7 days. 07/09/21 07/16/21  Darrick Grinder, PA-C  Multiple Vitamins-Minerals (ONE-A-DAY WOMENS PO) Take 2 tablets by mouth See admin instructions. Take 2 tablets by mouth when able to remember.    [provider]  glipiZIDE (GLUCOTROL) 10 MG tablet Take 1 tablet (10 mg total) by mouth  daily before breakfast. 10/09/19 10/09/19  Mardella Layman, MD    Allergies Patient has no known allergies.   REVIEW OF SYSTEMS  Negative except as noted here or in the History of Present Illness.   PHYSICAL EXAMINATION  Initial Vital Signs Blood pressure (!) 129/91, pulse 93, temperature 98.6 F (37 C), temperature source Oral, resp. rate 18, height 5\' 7"  (1.702 m), weight 90.7 kg, last menstrual period 02/11/2022, SpO2 98 %.  Examination General: Well-developed, well-nourished female in no acute distress; appearance consistent with age of record HENT: normocephalic; atraumatic Eyes: Normal appearance Neck: supple Heart: regular rate and rhythm Lungs: clear to auscultation bilaterally Abdomen: soft; nondistended Back: Lower lumbar midline tenderness Extremities: No deformity; full range of motion Neurologic: Awake, alert and oriented; motor function intact in all extremities and symmetric; no facial droop Skin: Warm and dry Psychiatric: Normal mood and affect   RESULTS  Summary of this visit's results, reviewed and interpreted by myself:   EKG Interpretation  Date/Time:    Ventricular Rate:    PR Interval:    QRS Duration:   QT Interval:    QTC Calculation:   R Axis:     Text Interpretation:         Laboratory Studies: Results for orders placed or performed during the hospital encounter of 02/19/22 (from the past 24 hour(s))  Pregnancy,  urine     Status: None   Collection Time: 02/19/22  1:11 AM  Result Value Ref Range   Preg Test, Ur NEGATIVE NEGATIVE   Imaging Studies: DG Lumbar Spine Complete  Result Date: 02/19/2022 CLINICAL DATA:  Fall, back pain EXAM: LUMBAR SPINE - COMPLETE 4+ VIEW COMPARISON:  None Available. FINDINGS: There is no evidence of lumbar spine fracture. Alignment is normal. Intervertebral disc spaces are maintained. IMPRESSION: Negative. Electronically Signed   By: Fidela Salisbury M.D.   On: 02/19/2022 02:43    ED COURSE and MDM  Nursing  notes, initial and subsequent vitals signs, including pulse oximetry, reviewed and interpreted by myself.  Vitals:   02/19/22 0057 02/19/22 0100  BP: (!) 129/91   Pulse: 93   Resp: 18   Temp: 98.6 F (37 C)   TempSrc: Oral   SpO2: 98%   Weight:  90.7 kg  Height:  5\' 7"  (1.702 m)   Medications  naproxen (NAPROSYN) tablet 500 mg (has no administration in time range)  HYDROcodone-acetaminophen (NORCO/VICODIN) 5-325 MG per tablet 1 tablet (has no administration in time range)  cyclobenzaprine (FLEXERIL) tablet 10 mg (has no administration in time range)    No evidence of acute bony injury on plain films.  No radiation of pain to suggest sciatica.  No neurologic symptoms.  I suspect this represents acute strain and we will treat symptomatically.  PROCEDURES  Procedures   ED DIAGNOSES     ICD-10-CM   1. Fall, initial encounter  W19.XXXA     2. Acute midline low back pain without sciatica  M54.50          Reyna Lorenzi, MD 02/19/22 0300
